# Patient Record
Sex: Male | Born: 1990 | Race: Black or African American | Hispanic: No | Marital: Single | Smoking: Never smoker
Health system: Southern US, Community
[De-identification: ages and names within clinical notes are randomized; demographics above are authoritative.]

## PROBLEM LIST (undated history)

## (undated) HISTORY — PX: NO PAST SURGERIES: SHX2092

---

## 2009-10-21 ENCOUNTER — Emergency Department (HOSPITAL_COMMUNITY): Admission: EM | Admit: 2009-10-21 | Discharge: 2009-10-21 | Payer: Self-pay | Admitting: Emergency Medicine

## 2012-01-13 ENCOUNTER — Encounter (HOSPITAL_COMMUNITY): Payer: Self-pay | Admitting: *Deleted

## 2012-01-13 ENCOUNTER — Emergency Department (INDEPENDENT_AMBULATORY_CARE_PROVIDER_SITE_OTHER): Payer: Self-pay

## 2012-01-13 ENCOUNTER — Emergency Department (INDEPENDENT_AMBULATORY_CARE_PROVIDER_SITE_OTHER)
Admission: EM | Admit: 2012-01-13 | Discharge: 2012-01-13 | Disposition: A | Discharge status: Home / Self Care | Payer: Self-pay | Source: Home / Self Care | Attending: Family Medicine | Admitting: Family Medicine

## 2012-01-13 DIAGNOSIS — IMO0002 Reserved for concepts with insufficient information to code with codable children: Secondary | ICD-10-CM

## 2012-01-13 DIAGNOSIS — S46919A Strain of unspecified muscle, fascia and tendon at shoulder and upper arm level, unspecified arm, initial encounter: Secondary | ICD-10-CM

## 2012-01-13 MED ORDER — IBUPROFEN 800 MG PO TABS: 800.0000 mg | ORAL_TABLET | Freq: Three times a day (TID) | ORAL | Status: DC

## 2012-01-13 MED ORDER — HYDROCODONE-ACETAMINOPHEN 5-325 MG PO TABS: 2.0000 | ORAL_TABLET | ORAL | Status: DC | PRN

## 2012-01-13 NOTE — ED Notes (Signed)
Pt was playing football 2 days ago and injured his right shoulder.   The pain is worse today.  He denies distal tingling, good pulses and skin warmth

## 2012-01-13 NOTE — ED Provider Notes (Signed)
History     CSN: [id number]  Arrival date & time 01/13/12  1604   None     Chief Complaint  Patient presents with  . Shoulder Pain    (Consider location/radiation/quality/duration/timing/severity/associated sxs/prior treatment) Patient is a 21 y.o. male presenting with shoulder injury. The history is provided by the patient. No language interpreter was used.  Shoulder Injury This is a new problem. The current episode started yesterday. The problem occurs constantly. The problem has been gradually worsening. Pertinent negatives include no chest pain and no headaches. The symptoms are aggravated by twisting. Nothing relieves the symptoms. He has tried nothing for the symptoms.   Pt reports he fell on his shoulder while plaing football.   Pt complains of swelling and pain History reviewed. No pertinent past medical history.  History reviewed. No pertinent past surgical history.  History reviewed. No pertinent family history.  History  Substance Use Topics  . Smoking status: Never Smoker   . Smokeless tobacco: Not on file  . Alcohol Use: Yes     Comment: occasionally      Review of Systems  Cardiovascular: Negative for chest pain.  Neurological: Negative for headaches.  All other systems reviewed and are negative.    Allergies  Review of patient's allergies indicates no known allergies.  Home Medications  No current outpatient prescriptions on file.  BP 132/81  Pulse 55  Temp 99.3 F (37.4 C) (Oral)  Resp 16  SpO2 100%  Physical Exam  Nursing note and vitals reviewed. Constitutional: He appears well-developed and well-nourished.  HENT:  Head: Normocephalic.  Right Ear: External ear normal.  Left Ear: External ear normal.  Neck: Normal range of motion.  Pulmonary/Chest: Effort normal.  Musculoskeletal: Normal range of motion.       Tender right shoulder mid joint,  No deformity,  nv and nsintact, pain with movement  Neurological: He is alert.  Skin:  Skin is warm.  Psychiatric: He has a normal mood and affect.    ED Course  Procedures (including critical care time)  Labs Reviewed - No data to display No results found.   1. Shoulder strain       MDM  Ibuprofen, hydrocodone,  Follow up with Dr. Lestine Box for evaluation        Elson Areas, [place] 01/13/12 1821  Lonia Skinner [place], [place] 01/13/12 [phone]

## 2012-01-15 NOTE — ED Provider Notes (Signed)
Medical screening examination/treatment/procedure(s) were performed by resident physician or non-physician practitioner and as supervising physician I was immediately available for consultation/collaboration.   Barkley Bruns MD.    Linna Hoff, MD 01/15/12 2119

## 2012-03-07 ENCOUNTER — Emergency Department (HOSPITAL_COMMUNITY): Payer: BC Managed Care – PPO

## 2012-03-07 ENCOUNTER — Encounter (HOSPITAL_COMMUNITY): Payer: Self-pay | Admitting: Adult Health

## 2012-03-07 ENCOUNTER — Emergency Department (HOSPITAL_COMMUNITY)
Admission: EM | Admit: 2012-03-07 | Discharge: 2012-03-07 | Disposition: A | Discharge status: Home Health | Payer: BC Managed Care – PPO | Attending: Emergency Medicine | Admitting: Emergency Medicine

## 2012-03-07 DIAGNOSIS — R040 Epistaxis: Secondary | ICD-10-CM | POA: Insufficient documentation

## 2012-03-07 DIAGNOSIS — IMO0002 Reserved for concepts with insufficient information to code with codable children: Secondary | ICD-10-CM | POA: Insufficient documentation

## 2012-03-07 DIAGNOSIS — T07XXXA Unspecified multiple injuries, initial encounter: Secondary | ICD-10-CM | POA: Insufficient documentation

## 2012-03-07 DIAGNOSIS — Z23 Encounter for immunization: Secondary | ICD-10-CM | POA: Insufficient documentation

## 2012-03-07 DIAGNOSIS — S3981XA Other specified injuries of abdomen, initial encounter: Secondary | ICD-10-CM | POA: Insufficient documentation

## 2012-03-07 MED ORDER — TETANUS-DIPHTHERIA TOXOIDS TD 5-2 LFU IM INJ
0.5000 mL | INJECTION | Freq: Once | INTRAMUSCULAR | Status: AC
  Administered 2012-03-07: 0.5 mL via INTRAMUSCULAR
  Filled 2012-03-07: qty 0.5

## 2012-03-07 MED ORDER — HYDROCODONE-ACETAMINOPHEN 5-325 MG PO TABS: 1.0000 | ORAL_TABLET | ORAL | Status: DC | PRN

## 2012-03-07 MED ORDER — OXYCODONE-ACETAMINOPHEN 5-325 MG PO TABS
2.0000 | ORAL_TABLET | Freq: Once | ORAL | Status: AC
  Administered 2012-03-07: 2 via ORAL
  Filled 2012-03-07: qty 2

## 2012-03-07 NOTE — ED Notes (Signed)
Pt refuses rib xrays. States that they do not hurt bad and that he does not want to have to pay for an un needed test

## 2012-03-07 NOTE — ED Notes (Signed)
CSI at bedside taking photographs
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
## 2012-03-07 NOTE — ED Notes (Signed)
Pt states he is feeling back to normal and wants to go home and go to bed. Friend/family member at bedside
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
## 2012-03-07 NOTE — ED Notes (Signed)
Pt remains in radiology 

## 2012-03-07 NOTE — ED Notes (Signed)
MD at bedside. C-collar removed/.

## 2012-03-08 NOTE — ED Provider Notes (Signed)
Medical screening examination/treatment/procedure(s) were performed by non-physician practitioner and as supervising physician I was immediately available for consultation/collaboration.  Chardae Mulkern T Jenavi Beedle, MD 03/08/12 2317 

## 2012-06-12 ENCOUNTER — Encounter (HOSPITAL_COMMUNITY): Payer: Self-pay | Admitting: Emergency Medicine

## 2012-06-12 ENCOUNTER — Emergency Department (INDEPENDENT_AMBULATORY_CARE_PROVIDER_SITE_OTHER): Payer: BC Managed Care – PPO

## 2012-06-12 ENCOUNTER — Emergency Department (INDEPENDENT_AMBULATORY_CARE_PROVIDER_SITE_OTHER)
Admission: EM | Admit: 2012-06-12 | Discharge: 2012-06-12 | Disposition: A | Discharge status: Home / Self Care | Payer: Worker's Compensation | Source: Home / Self Care | Attending: Family Medicine | Admitting: Family Medicine

## 2012-06-12 DIAGNOSIS — IMO0002 Reserved for concepts with insufficient information to code with codable children: Secondary | ICD-10-CM

## 2012-06-12 DIAGNOSIS — Z23 Encounter for immunization: Secondary | ICD-10-CM

## 2012-06-12 DIAGNOSIS — S61409A Unspecified open wound of unspecified hand, initial encounter: Secondary | ICD-10-CM

## 2012-06-12 DIAGNOSIS — S61431A Puncture wound without foreign body of right hand, initial encounter: Secondary | ICD-10-CM

## 2012-06-12 MED ORDER — HYDROCODONE-ACETAMINOPHEN 5-325 MG PO TABS: 1.0000 | ORAL_TABLET | ORAL | Status: DC | PRN

## 2012-06-12 MED ORDER — TETANUS-DIPHTH-ACELL PERTUSSIS 5-2.5-18.5 LF-MCG/0.5 IM SUSP
0.5000 mL | Freq: Once | INTRAMUSCULAR | Status: AC
  Administered 2012-06-12: 0.5 mL via INTRAMUSCULAR

## 2012-06-12 MED ORDER — TETANUS-DIPHTH-ACELL PERTUSSIS 5-2.5-18.5 LF-MCG/0.5 IM SUSP
INTRAMUSCULAR | Status: AC
  Filled 2012-06-12: qty 0.5
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
## 2012-06-14 ENCOUNTER — Telehealth (HOSPITAL_COMMUNITY): Payer: Self-pay | Admitting: Family Medicine

## 2012-06-14 NOTE — ED Notes (Signed)
Let message for her to send a release of medical records and note left for medical records staff

## 2012-06-14 NOTE — ED Provider Notes (Signed)
Medical screening examination/treatment/procedure(s) were performed by resident physician or non-physician practitioner and as supervising physician I was immediately available for consultation/collaboration.   Breyana Follansbee DOUGLAS MD.   Ludmila Ebarb D Ashlynd Michna, MD 06/14/12 1822 
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
## 2015-04-06 ENCOUNTER — Ambulatory Visit (INDEPENDENT_AMBULATORY_CARE_PROVIDER_SITE_OTHER): Payer: Self-pay | Admitting: Physician Assistant

## 2015-04-06 VITALS — BP 120/72 | HR 85 | Temp 98.0°F | Resp 17 | Ht 66.0 in | Wt 214.0 lb

## 2015-04-06 DIAGNOSIS — Z024 Encounter for examination for driving license: Secondary | ICD-10-CM

## 2015-04-06 DIAGNOSIS — Z021 Encounter for pre-employment examination: Secondary | ICD-10-CM

## 2015-04-06 NOTE — Progress Notes (Signed)
This patient presents for DOT examination for fitness for duty.  Pt has had DOT exams in the past.  Medical History: no  Any illness or injury in the last 5 years? no  Head/Brain Injuries, disorders or illnesses no  Seizures, epilepsy no  Eye disorders or impaired vision (except corrective lenses) no  Ear disorders, loss of hearing or balance no  Heart disease or heart attack; other cardiovascular condition no  Heart surgery (valve replacement/bypass, angioplasty, pacemaker) no  High blood pressure no  Muscular disease no  Shortness of breath no  Lung disease, emphysema, asthma, chronic bronchitis no  Kidney disease, dialysis no  Liver disease no  Digestive problems no  Diabetes or elevated blood sugar no  Nervious or psychiatric disorders, e.g., severe depression no  Loss of, or altered consciousness no  Fainting, dizziness no  Sleep disorders, pauses in breathing while asleep, daytime sleepiness, loud snoring no  Stroke or paralysis no  Missing or impaired hand, arm, foot, leg, finger, toe no  Spinal injury or disease no  Chronic low back pain no  Regular, frequent alcohol use no  Narcotic or habit forming drug use  Current Medications: Prior to Admission medications   Not on File    Medical Examiner's Comments on Health History:  Smoker - none  TESTING:   Visual Acuity Screening   Right eye Left eye Both eyes  Without correction:  With correction:     Comments: Peripheral Vision: Right eye 85 degrees. Left eye 85 degrees. The patient can distinguish the colors red, amber and green.  Hearing Screening Comments: The patient was able to hear a forced whisper from 10 feet.  Monocular Vision: No.  Hearing Aid used for test: No. Hearing Aid required to to meet standard: No.  BP 120/72 mmHg  Pulse 85  Temp(Src) 98 F (36.7 C) (Oral)  Resp 17  Ht  (1.676 m)  Wt 214 lb (97.07 kg)  BMI 34.56 kg/m2  SpO2 98% Pulse rate is regular  Urine  Specimen: Specific Gravity 1.015, Protein neg, Blood neg, Sugar neg  PHYSICAL EXAMINATION:  1. No. General Appearance: Marked overweight, tremor, signs of alcoholism, problem drinking or drug abuse. 2. No. Eyes: pupillary equality, reaction to light, accommodation, ocular motility, ocular muscle imbalance, extra ocular movement, nystagmus, exopthalmos. Ask about retinopathy, cataracts, aphakia, glaucoma, macular degeneration and refer to a specialist if appropriate.  3. No. Ears: Scarring of tympanic membrane, occlusion of external canal, perforated eardrums.     4. No. Mouth and Throat: Irremedial deformities likely to interfere with breathing or swallowing.    5. No. Heart: Murmurs, extra sounds, enlarged heart, pacemaker, implantable defibrillator.     6. No. Lungs and Chest, not including breast examination: Abnormal Chest wall expansion, abnormal respiratory rate, abnormal breath sounds including wheezes or alveolar rates, impaired respiratory function, cyanosis. Abnormal findings on physical exam may require further testing such as pulmonary tests and/or x ray of chest.  7. No. Abdomen and Viscera: Enlarged liver, enlarged spleen, masses, bruits, hernia, significant abdominal wall muscle weakness.  8. No. Vascular System: Abnormal pulse and amplitude, carotid or arterial bruits, varicose veins.    9. No. Genitourinary System: Hernia  10. No. Extremities-Limb impaired: Loss or impairment of leg, foot, toe, arm, hand, finger. Perceptible limp, deformities, atrophy, weakness, paralysis, clubbing, edema, hypotonia. Insufficient grasp and prehension to maintain steering wheel grip. Insufficient mobility and strength in lower limb to operate pedals properly. 11. No. Spine, other musculoskeletal: Previous  surgery, deformities, limitation of motion, tenderness.  12. No. Neurological: Impaired equilibrium, coordination or speech pattern; paresthesia, asymmetric deep tendon reflexes, sensory or positional  abnormalities, abnormal patellar and Babinski's reflexes, ataxia.     Comments:   Certification Status: does meet standards for 2 year certificate.   Return to medical examiner's office for follow-up on   Wearing corrective lenses: no Wearing hearing aid: no Accompanied by no waiver/exemption   Certification expires 04/05/2017  Benny Lennert PA-C  [facility] 04/06/2015 11:52 AM

## 2016-08-28 ENCOUNTER — Encounter: Payer: Self-pay | Admitting: Family Medicine

## 2016-09-01 ENCOUNTER — Ambulatory Visit (INDEPENDENT_AMBULATORY_CARE_PROVIDER_SITE_OTHER): Payer: PRIVATE HEALTH INSURANCE | Admitting: Physician Assistant

## 2016-09-01 ENCOUNTER — Encounter: Payer: Self-pay | Admitting: Physician Assistant

## 2016-09-01 VITALS — BP 140/92 | HR 74 | Temp 98.1°F | Resp 16 | Ht 67.75 in | Wt 220.8 lb

## 2016-09-01 DIAGNOSIS — R030 Elevated blood-pressure reading, without diagnosis of hypertension: Secondary | ICD-10-CM

## 2016-09-01 DIAGNOSIS — Z13228 Encounter for screening for other metabolic disorders: Secondary | ICD-10-CM

## 2016-09-01 DIAGNOSIS — Z0000 Encounter for general adult medical examination without abnormal findings: Secondary | ICD-10-CM

## 2016-09-01 DIAGNOSIS — E7800 Pure hypercholesterolemia, unspecified: Secondary | ICD-10-CM

## 2016-09-01 DIAGNOSIS — Z130 Encounter for screening for diseases of the blood and blood-forming organs and certain disorders involving the immune mechanism: Secondary | ICD-10-CM | POA: Diagnosis not present

## 2016-09-01 DIAGNOSIS — Z13220 Encounter for screening for lipoid disorders: Secondary | ICD-10-CM | POA: Diagnosis not present

## 2016-09-01 DIAGNOSIS — R519 Headache, unspecified: Secondary | ICD-10-CM

## 2016-09-01 DIAGNOSIS — Z0001 Encounter for general adult medical examination with abnormal findings: Secondary | ICD-10-CM | POA: Diagnosis not present

## 2016-09-01 DIAGNOSIS — Z114 Encounter for screening for human immunodeficiency virus [HIV]: Secondary | ICD-10-CM

## 2016-09-01 DIAGNOSIS — R51 Headache: Secondary | ICD-10-CM

## 2016-09-01 NOTE — Patient Instructions (Addendum)
   IF you received an x-ray today, you will receive an invoice from [facility]. Please contact [facility] at [phone] with questions or concerns regarding your invoice.   IF you received labwork today, you will receive an invoice from [facility]. Please contact [facility] at [phone] with questions or concerns regarding your invoice.   Our billing staff will not be able to assist you with questions regarding bills from these companies.  You will be contacted with the lab results as soon as they are available. The fastest way to get your results is to activate your My Chart account. Instructions are located on the last page of this paperwork. If you have not heard from us regarding the results in 2 weeks, please contact this office.     Hypertension Hypertension is another name for high blood pressure. High blood pressure forces your heart to work harder to pump blood. This can cause problems over time. There are two numbers in a blood pressure reading. There is a top number (systolic) over a bottom number (diastolic). It is best to have a blood pressure below 120/80. Healthy choices can help lower your blood pressure. You may need medicine to help lower your blood pressure if:  Your blood pressure cannot be lowered with healthy choices.  Your blood pressure is higher than 130/80.  Follow these instructions at home: Eating and drinking  If directed, follow the DASH eating plan. This diet includes: ? Filling half of your plate at each meal with fruits and vegetables. ? Filling one quarter of your plate at each meal with whole grains. Whole grains include whole wheat pasta, brown rice, and whole grain bread. ? Eating or drinking low-fat dairy products, such as skim milk or low-fat yogurt. ? Filling one quarter of your plate at each meal with low-fat (lean) proteins. Low-fat proteins include fish, skinless chicken, eggs, beans, and tofu. ? Avoiding fatty meat, cured  and processed meat, or chicken with skin. ? Avoiding premade or processed food.  Eat less than 1,500 mg of salt (sodium) a day.  Limit alcohol use to no more than 1 drink a day for nonpregnant women and 2 drinks a day for men. One drink equals 12 oz of beer, 5 oz of wine, or 1 oz of hard liquor. Lifestyle  Work with your doctor to stay at a healthy weight or to lose weight. Ask your doctor what the best weight is for you.  Get at least 30 minutes of exercise that causes your heart to beat faster (aerobic exercise) most days of the week. This may include walking, swimming, or biking.  Get at least 30 minutes of exercise that strengthens your muscles (resistance exercise) at least 3 days a week. This may include lifting weights or pilates.  Do not use any products that contain nicotine or tobacco. This includes cigarettes and e-cigarettes. If you need help quitting, ask your doctor.  Check your blood pressure at home as told by your doctor.  Keep all follow-up visits as told by your doctor. This is important. Medicines  Take over-the-counter and prescription medicines only as told by your doctor. Follow directions carefully.  Do not skip doses of blood pressure medicine. The medicine does not work as well if you skip doses. Skipping doses also puts you at risk for problems.  Ask your doctor about side effects or reactions to medicines that you should watch for. Contact a doctor if:  You think you are having a reaction to the   medicine you are taking.  You have headaches that keep coming back (recurring).  You feel dizzy.  You have swelling in your ankles.  You have trouble with your vision. Get help right away if:  You get a very bad headache.  You start to feel confused.  You feel weak or numb.  You feel faint.  You get very bad pain in your: ? Chest. ? Belly (abdomen).  You throw up (vomit) more than once.  You have trouble breathing. Summary  Hypertension is  another name for high blood pressure.  Making healthy choices can help lower blood pressure. If your blood pressure cannot be controlled with healthy choices, you may need to take medicine. This information is not intended to replace advice given to you by your health care provider. Make sure you discuss any questions you have with your health care provider. Document Released: 08/06/2007 Document Revised: 01/16/2016 Document Reviewed: 01/16/2016 Elsevier Interactive Patient Education  2018 Elsevier Inc.  
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
## 2016-09-02 LAB — CBC WITH DIFFERENTIAL/PLATELET
BASOS ABS: 0.1 10*3/uL (ref 0.0–0.2)
Basos: 2 %
EOS (ABSOLUTE): 0.1 10*3/uL (ref 0.0–0.4)
EOS: 1 %
HEMATOCRIT: 43.7 % (ref 37.5–51.0)
HEMOGLOBIN: 14.8 g/dL (ref 13.0–17.7)
Immature Grans (Abs): 0 10*3/uL (ref 0.0–0.1)
Immature Granulocytes: 0 %
LYMPHS ABS: 2.4 10*3/uL (ref 0.7–3.1)
Lymphs: 36 %
MCH: 28.1 pg (ref 26.6–33.0)
MCHC: 33.9 g/dL (ref 31.5–35.7)
MCV: 83 fL (ref 79–97)
MONOCYTES: 10 %
Monocytes Absolute: 0.6 10*3/uL (ref 0.1–0.9)
NEUTROS ABS: 3.3 10*3/uL (ref 1.4–7.0)
Neutrophils: 51 %
Platelets: 282 10*3/uL (ref 150–379)
RBC: 5.26 x10E6/uL (ref 4.14–5.80)
RDW: 14.2 % (ref 12.3–15.4)
WBC: 6.6 10*3/uL (ref 3.4–10.8)

## 2016-09-02 LAB — CMP14+EGFR
ALBUMIN: 4.5 g/dL (ref 3.5–5.5)
ALK PHOS: 147 IU/L — AB (ref 39–117)
ALT: 63 IU/L — ABNORMAL HIGH (ref 0–44)
AST: 27 IU/L (ref 0–40)
Albumin/Globulin Ratio: 1.5 (ref 1.2–2.2)
BILIRUBIN TOTAL: 0.4 mg/dL (ref 0.0–1.2)
BUN / CREAT RATIO: 11 (ref 9–20)
BUN: 11 mg/dL (ref 6–20)
CHLORIDE: 99 mmol/L (ref 96–106)
CO2: 25 mmol/L (ref 20–29)
Calcium: 9.8 mg/dL (ref 8.7–10.2)
Creatinine, Ser: 0.96 mg/dL (ref 0.76–1.27)
GFR calc Af Amer: 125 mL/min/{1.73_m2} (ref >59)
GFR calc non Af Amer: 108 mL/min/{1.73_m2} (ref >59)
GLOBULIN, TOTAL: 3 g/dL (ref 1.5–4.5)
Glucose: 83 mg/dL (ref 65–99)
Potassium: 3.9 mmol/L (ref 3.5–5.2)
SODIUM: 139 mmol/L (ref 134–144)
Total Protein: 7.5 g/dL (ref 6.0–8.5)

## 2016-09-02 LAB — LIPID PANEL
CHOLESTEROL TOTAL: 248 mg/dL — AB (ref 100–199)
Chol/HDL Ratio: 7.5 ratio — ABNORMAL HIGH (ref 0.0–5.0)
HDL: 33 mg/dL — ABNORMAL LOW (ref >39)
LDL CALC: 162 mg/dL — AB (ref 0–99)
Triglycerides: 264 mg/dL — ABNORMAL HIGH (ref 0–149)
VLDL Cholesterol Cal: 53 mg/dL — ABNORMAL HIGH (ref 5–40)

## 2016-09-02 LAB — TSH: TSH: 2.71 u[IU]/mL (ref 0.450–4.500)

## 2016-09-02 LAB — HIV ANTIBODY (ROUTINE TESTING W REFLEX): HIV SCREEN 4TH GENERATION: NONREACTIVE

## 2016-09-06 DIAGNOSIS — E7800 Pure hypercholesterolemia, unspecified: Secondary | ICD-10-CM | POA: Insufficient documentation

## 2016-09-06 DIAGNOSIS — R030 Elevated blood-pressure reading, without diagnosis of hypertension: Secondary | ICD-10-CM | POA: Insufficient documentation

## 2016-09-06 MED ORDER — ROSUVASTATIN CALCIUM 20 MG PO TABS: 20.0000 mg | ORAL_TABLET | Freq: Every day | ORAL | 0 refills | Status: DC

## 2016-09-06 NOTE — Addendum Note (Signed)
Addended by: Morrell RiddleWEBER, Akita Maxim L on: 09/06/2016 05:22 PM   Modules accepted: Orders

## 2016-10-15 ENCOUNTER — Ambulatory Visit (INDEPENDENT_AMBULATORY_CARE_PROVIDER_SITE_OTHER): Payer: PRIVATE HEALTH INSURANCE | Admitting: Physician Assistant

## 2016-10-15 ENCOUNTER — Encounter: Payer: Self-pay | Admitting: Physician Assistant

## 2016-10-15 VITALS — BP 150/89 | HR 69 | Temp 98.3°F | Resp 16 | Ht 67.75 in | Wt 213.4 lb

## 2016-10-15 DIAGNOSIS — L988 Other specified disorders of the skin and subcutaneous tissue: Secondary | ICD-10-CM | POA: Diagnosis not present

## 2016-10-15 MED ORDER — DOXYCYCLINE HYCLATE 100 MG PO CAPS: 100.0000 mg | ORAL_CAPSULE | Freq: Two times a day (BID) | ORAL | 0 refills | Status: AC

## 2016-10-15 MED ORDER — MELOXICAM 15 MG PO TABS: 15.0000 mg | ORAL_TABLET | Freq: Every day | ORAL | 0 refills | Status: DC

## 2016-10-15 NOTE — Progress Notes (Signed)
PRIMARY CARE AT [facility] [address] [place] [address] [phone]  Date:  10/15/2016   Name:  Marvin Dean   DOB:  23-Aug-1990   MRN:  [id number]  PCP:  Patient, No Pcp Per    History of Present Illness:  Marvin Dean is a 26 y.o. male patient who presents to PCP with  Chief Complaint  Patient presents with  . Back Pain    having numbness and pain when pt sits down x1day     Pain started yesterday when he woke up, where he noticed the pain when he sits.  Now it is painful to sit down.  He has noticed it in his tailbone.  He can feel sensation of "numbness".  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  He was able to play softball yesterday without pain.  No pain with running.  No pain with bowel movement.  Urination normal.  He has never injured this area in the past.    Patient Active Problem List   Diagnosis Date Noted  . Pure hypercholesterolemia 09/06/2016  . Elevated BP without diagnosis of hypertension 09/06/2016    No past medical history on file.  No past surgical history on file.  Social History  Substance Use Topics  . Smoking status: Never Smoker  . Smokeless tobacco: Never Used  . Alcohol use Yes     Comment: 2 drinks a week    Family History  Problem Relation Age of Onset  . COPD Mother   . Lung cancer Mother     No Known Allergies  Medication list has been reviewed and updated.  Current Outpatient Prescriptions on File Prior to Visit  Medication Sig Dispense Refill  . rosuvastatin (CRESTOR) 20 MG tablet Take 1 tablet (20 mg total) by mouth daily. (Patient not taking: Reported on 10/15/2016) 90 tablet 0   No current facility-administered medications on file prior to visit.     ROS ROS otherwise unremarkable unless listed above.  Physical Examination: BP (!) 150/89   Pulse 69   Temp 98.3 F (36.8 C) (Oral)   Resp 16   Ht 5' 7.75" (1.721 m)   Wt 213 lb 6.4 oz (96.8 kg)   SpO2 99%   BMI 32.69 kg/m  Ideal Body Weight: Weight  in (lb) to have BMI = 25: 162.9  Physical Exam  Constitutional: He is oriented to person, place, and time. He appears well-developed and well-nourished. No distress.  HENT:  Head: Normocephalic and atraumatic.  Eyes: Pupils are equal, round, and reactive to light. Conjunctivae and EOM are normal.  Cardiovascular: Normal rate.   Pulmonary/Chest: Effort normal. No respiratory distress.  Neurological: He is alert and oriented to person, place, and time.  Skin: Skin is warm and dry. He is not diaphoretic.  Just inferior to the gluteal cleft, is slight breaking down of skin and tenderness.  No fluctuance or warmth.  Possible area of punctate.  Psychiatric: He has a normal mood and affect. His behavior is normal.     Assessment and Plan: Marvin Dean is a 26 y.o. male who is here today for cc of  Chief Complaint  Patient presents with  . Back Pain    having numbness and pain when pt sits down x1day   Coccyalgia vs pilonidal disease Likely pilonidal.  Advised warm compresses, avoidance of pressure to area, and abx follow up in 48-72 hours if no improvement. Pilonidal disease - Plan: meloxicam (MOBIC) 15 MG tablet,  doxycycline (VIBRAMYCIN) 100 MG capsule  Trena PlattStephanie English, PA-C [facility] 9/2/20189:32 AM

## 2016-10-15 NOTE — Patient Instructions (Addendum)
Please use the warm compresses at least 3 times per day for 15 minutes I would like you to take the medications as prescribed.  If the symptoms resolve within 7 days, you may stop the doxycycline--but take for at least 7 days. Pilonidal disease can be classified as a disease, where you have to be mindful of the risk below, to avoid it starting up the pain and inflammation again.  Please review.   Pilonidal Cyst A pilonidal cyst is a fluid-filled sac. It forms beneath the skin near your tailbone, at the top of the crease of your buttocks. A pilonidal cyst that is not large or infected may not cause symptoms or problems. If the cyst becomes irritated or infected, it may fill with pus. This causes pain and swelling (pilonidal abscess). An infected cyst may need to be treated with medicine, drained, or removed. What are the causes? The cause of a pilonidal cyst is not known. One cause may be a hair that grows into your skin (ingrown hair). What increases the risk? Pilonidal cysts are more common in boys and men. Risk factors include:  Having lots of hair near the crease of the buttocks.  Being overweight.  Having a pilonidal dimple.  Wearing tight clothing.  Not bathing or showering frequently.  Sitting for long periods of time.  What are the signs or symptoms? Signs and symptoms of a pilonidal cyst may include:  Redness.  Pain and tenderness.  Warmth.  Swelling.  Pus.  Fever.  How is this diagnosed? Your health care provider may diagnose a pilonidal cyst based on your symptoms and a physical exam. The health care provider may do a blood test to check for infection. If your cyst is draining pus, your health care provider may take a sample of the drainage to be tested at a laboratory. How is this treated? Surgery is the usual treatment for an infected pilonidal cyst. You may also have to take medicines before surgery. The type of surgery you have depends on the size and severity  of the infected cyst. The different kinds of surgery include:  Incision and drainage. This is a procedure to open and drain the cyst.  Marsupialization. In this procedure, a large cyst or abscess may be opened and kept open by stitching the edges of the skin to the cyst walls.  Cyst removal. This procedure involves opening the skin and removing all or part of the cyst.  Follow these instructions at home:  Follow all of your surgeon's instructions carefully if you had surgery.  Take medicines only as directed by your health care provider.  If you were prescribed an antibiotic medicine, finish it all even if you start to feel better.  Keep the area around your pilonidal cyst clean and dry.  Clean the area as directed by your health care provider. Pat the area dry with a clean towel. Do not rub it as this may cause bleeding.  Remove hair from the area around the cyst as directed by your health care provider.  Do not wear tight clothing or sit in one place for long periods of time.  There are many different ways to close and cover an incision, including stitches, skin glue, and adhesive strips. Follow your health care provider's instructions on: ? Incision care. ? Bandage (dressing) changes and removal. ? Incision closure removal. Contact a health care provider if:  You have drainage, redness, swelling, or pain at the site of the cyst.  You have a  fever. This information is not intended to replace advice given to you by your health care provider. Make sure you discuss any questions you have with your health care provider. Document Released: 02/15/2000 Document Revised: 07/26/2015 Document Reviewed: 07/07/2013 Elsevier Interactive Patient Education  2018 [employer]     IF you received an x-ray today, you will receive an invoice from [facility]. Please contact [facility] at [phone] with questions or concerns regarding your invoice.   IF you received  labwork today, you will receive an [facility] from [facility]. Please contact [facility] at [phone] with questions or concerns regarding your invoice.   Our billing staff will not be able to assist you with questions regarding bills from these companies.  You will be contacted with the lab results as soon as they are available. The fastest way to get your results is to activate your My Chart account. Instructions are located on the last page of this paperwork. If you have not heard from us regarding the results in 2 weeks, please contact this office.

## 2016-10-17 ENCOUNTER — Ambulatory Visit (INDEPENDENT_AMBULATORY_CARE_PROVIDER_SITE_OTHER): Payer: PRIVATE HEALTH INSURANCE | Admitting: Physician Assistant

## 2016-10-17 ENCOUNTER — Encounter: Payer: Self-pay | Admitting: Physician Assistant

## 2016-10-17 VITALS — BP 132/80 | HR 80 | Temp 98.8°F | Resp 18 | Ht 68.11 in | Wt 215.4 lb

## 2016-10-17 DIAGNOSIS — L0501 Pilonidal cyst with abscess: Secondary | ICD-10-CM | POA: Diagnosis not present

## 2016-10-17 DIAGNOSIS — L918 Other hypertrophic disorders of the skin: Secondary | ICD-10-CM

## 2016-10-17 NOTE — Progress Notes (Signed)
Marvin Dean  MRN: [id number] DOB: [birth year]  PCP: Patient, No Pcp Per  Chief Complaint  Patient presents with  . Mass    X 4 days- Cyst f/u    Subjective:  Pt presents to clinic for pilonidal cyst - he is taking medication as Rx - not having any problems but the pain is not getting better.  He has been doing warm compresses but it is not getting better.  He has never had one of these before.  He also has a spot on his neck that he would like to know what it is.  History is obtained by patient.  Review of Systems  Constitutional: Negative for chills and fever.  Skin: Positive for wound.    Patient Active Problem List   Diagnosis Date Noted  . Pure hypercholesterolemia 09/06/2016  . Elevated BP without diagnosis of hypertension 09/06/2016    Current Outpatient Prescriptions on File Prior to Visit  Medication Sig Dispense Refill  . doxycycline (VIBRAMYCIN) 100 MG capsule Take 1 capsule (100 mg total) by mouth 2 (two) times daily. 20 capsule 0  . meloxicam (MOBIC) 15 MG tablet Take 1 tablet (15 mg total) by mouth daily. 30 tablet 0  . rosuvastatin (CRESTOR) 20 MG tablet Take 1 tablet (20 mg total) by mouth daily. (Patient not taking: Reported on 10/15/2016) 90 tablet 0   No current facility-administered medications on file prior to visit.     No Known Allergies  History reviewed. No pertinent past medical history. Social History   Social History Narrative   Lives alone      Seatbelt - most of the time      Guns - securedf   Social History  Substance Use Topics  . Smoking status: Never Smoker  . Smokeless tobacco: Never Used  . Alcohol use Yes     Comment: 2 drinks a week   family history includes COPD in his mother; Lung cancer in his mother.     Objective:  BP 132/80 (BP Location: Right Arm, Patient Position: Sitting, Cuff Size: Large)   Pulse 80   Temp 98.8 F (37.1 C) (Oral)   Resp 18   Ht 5' 8.11" (1.73 m)   Wt 215 lb 6.4 oz (97.7 kg)   BMI  32.65 kg/m  Body mass index is 32.65 kg/m.  Physical Exam  Constitutional: He is oriented to person, place, and time and well-developed, well-nourished, and in no distress.  HENT:  Head: Normocephalic and atraumatic.  Right Ear: External ear normal.  Left Ear: External ear normal.  Eyes: Conjunctivae are normal.  Neck: Normal range of motion.  Pulmonary/Chest: Effort normal.  Genitourinary:  Genitourinary Comments: Pilonidal cyst - tender and fluctuant midline with erythema and swelling in surrounding tissue more on the left that the right.  Multiple pores with purulent material.  Neurological: He is alert and oriented to person, place, and time. Gait normal.  Skin: Skin is warm and dry.  Psychiatric: Mood, memory, affect and judgment normal.   Procedure: Consent obtained - local anesthesia with 2% lido with epi.  Betadine prep.  #11 blade. used to make a 2cm incision - purulence expressed from wound cavity - 1/4 in plain packing placed into wound.  Drsg placed.  Skin tag was removed.   Assessment and Plan :  Pilonidal cyst with abscess  Skin tag   Continue abx - warm compresses - pull out packing in 72h and then can do warm soaks and gentle friction washing.  Benny Lennert PA-C  Primary Care at [facility] 10/17/2016 2:58 PM

## 2016-10-17 NOTE — Patient Instructions (Addendum)
Continue antibiotics. Dry warm compresses with a rice sock in the microwave. Pull out packing on Monday evening - after than ok to soak in bathtub and do gentle scrubbing of the lesion to present scab from forming.   IF you received an x-ray today, you will receive an invoice from [facility]. Please contact [facility] at [phone] with questions or concerns regarding your invoice.   IF you received labwork today, you will receive an invoice from [facility]. Please contact [facility] at [phone] with questions or concerns regarding your invoice.   Our billing staff will not be able to assist you with questions regarding bills from these companies.  You will be contacted with the lab results as soon as they are available. The fastest way to get your results is to activate your My Chart account. Instructions are located on the last page of this paperwork. If you have not heard from [place] regarding the results in 2 weeks, please contact this office.

## 2016-10-20 ENCOUNTER — Ambulatory Visit (INDEPENDENT_AMBULATORY_CARE_PROVIDER_SITE_OTHER): Payer: PRIVATE HEALTH INSURANCE | Admitting: Physician Assistant

## 2016-10-20 ENCOUNTER — Encounter: Payer: Self-pay | Admitting: Physician Assistant

## 2016-10-20 VITALS — BP 130/77 | HR 63 | Temp 98.4°F | Resp 16 | Ht 68.11 in | Wt 214.0 lb

## 2016-10-20 DIAGNOSIS — L0501 Pilonidal cyst with abscess: Secondary | ICD-10-CM

## 2016-10-20 NOTE — Patient Instructions (Signed)
     IF you received an x-ray today, you will receive an invoice from [facility]. Please contact [facility] at [phone] with questions or concerns regarding your invoice.   IF you received labwork today, you will receive an invoice from [facility]. Please contact [facility] at [phone] with questions or concerns regarding your invoice.   Our billing staff will not be able to assist you with questions regarding bills from these companies.  You will be contacted with the lab results as soon as they are available. The fastest way to get your results is to activate your My Chart account. Instructions are located on the last page of this paperwork. If you have not heard from us regarding the results in 2 weeks, please contact this office.     

## 2016-10-20 NOTE — Progress Notes (Signed)
   Marvin Dean  MRN: [id number] DOB: Feb 01, 1991  PCP: Patient, No Pcp Per  Chief Complaint  Patient presents with  . Wound Check    follow up     Subjective:  Pt presents to clinic for recheck pilonidal abscess.  Taking the abx without problems.  Doing warm compresses.  Much less pain, minimal drainage - he does feel like the area gets more hard and tender after he takes the doxy.  History is obtained by patient.  Review of Systems  Constitutional: Negative for chills and fever.  Gastrointestinal: Negative for nausea.  Skin: Positive for wound.    Patient Active Problem List   Diagnosis Date Noted  . Pure hypercholesterolemia 09/06/2016  . Elevated BP without diagnosis of hypertension 09/06/2016    Current Outpatient Prescriptions on File Prior to Visit  Medication Sig Dispense Refill  . doxycycline (VIBRAMYCIN) 100 MG capsule Take 1 capsule (100 mg total) by mouth 2 (two) times daily. 20 capsule 0  . meloxicam (MOBIC) 15 MG tablet Take 1 tablet (15 mg total) by mouth daily. 30 tablet 0  . rosuvastatin (CRESTOR) 20 MG tablet Take 1 tablet (20 mg total) by mouth daily. (Patient not taking: Reported on 10/15/2016) 90 tablet 0   No current facility-administered medications on file prior to visit.     No Known Allergies  No past medical history on file. Social History   Social History Narrative   Lives alone      Seatbelt - most of the time      Guns - securedf   Social History  Substance Use Topics  . Smoking status: Never Smoker  . Smokeless tobacco: Never Used  . Alcohol use Yes     Comment: 2 drinks a week   family history includes COPD in his mother; Lung cancer in his mother.     Objective:  BP 130/77   Pulse 63   Temp 98.4 F (36.9 C) (Oral)   Resp 16   Ht 5' 8.11" (1.73 m)   Wt 214 lb (97.1 kg)   SpO2 98%   BMI 32.43 kg/m  Body mass index is 32.43 kg/m.  Physical Exam  Constitutional: He is oriented to person, place, and time and  well-developed, well-nourished, and in no distress.  HENT:  Head: Normocephalic and atraumatic.  Right Ear: External ear normal.  Left Ear: External ear normal.  Eyes: Conjunctivae are normal.  Neck: Normal range of motion.  Pulmonary/Chest: Effort normal.  Neurological: He is alert and oriented to person, place, and time. Gait normal.  Skin: Skin is warm and dry.  drsg removed packing removed - less induration and no erythema - minimal TTP - small amount of serosanginous drainage from the wound cavity  Psychiatric: Mood, memory, affect and judgment normal.    Assessment and Plan :  Pilonidal cyst with abscess - much better - finish abs - continue to wear drsg just for drainage purposes - recheck only if problems  Benny Lennert PA-C  Primary Care at [facility] 10/20/2016 5:16 PM

## 2016-11-04 ENCOUNTER — Ambulatory Visit: Payer: PRIVATE HEALTH INSURANCE | Admitting: Physician Assistant

## 2016-12-02 ENCOUNTER — Ambulatory Visit: Payer: PRIVATE HEALTH INSURANCE | Admitting: Physician Assistant

## 2016-12-06 ENCOUNTER — Ambulatory Visit (INDEPENDENT_AMBULATORY_CARE_PROVIDER_SITE_OTHER): Payer: PRIVATE HEALTH INSURANCE | Admitting: Physician Assistant

## 2016-12-06 ENCOUNTER — Encounter: Payer: Self-pay | Admitting: Physician Assistant

## 2016-12-06 VITALS — BP 116/68 | HR 68 | Temp 98.5°F | Resp 18 | Ht 68.66 in | Wt 214.4 lb

## 2016-12-06 DIAGNOSIS — R945 Abnormal results of liver function studies: Secondary | ICD-10-CM

## 2016-12-06 DIAGNOSIS — R7989 Other specified abnormal findings of blood chemistry: Secondary | ICD-10-CM

## 2016-12-06 DIAGNOSIS — E7800 Pure hypercholesterolemia, unspecified: Secondary | ICD-10-CM | POA: Diagnosis not present

## 2016-12-06 NOTE — Patient Instructions (Addendum)
   IF you received an x-ray today, you will receive an invoice from [facility]. Please contact [facility] at [phone] with questions or concerns regarding your invoice.   IF you received labwork today, you will receive an invoice from [facility]. Please contact [facility] at [phone] with questions or concerns regarding your invoice.   Our billing staff will not be able to assist you with questions regarding bills from these companies.  You will be contacted with the lab results as soon as they are available. The fastest way to get your results is to activate your My Chart account. Instructions are located on the last page of this paperwork. If you have not heard from us regarding the results in 2 weeks, please contact this office.     Cholesterol Cholesterol is a white, waxy, fat-like substance that is needed by the human body in small amounts. The liver makes all the cholesterol we need. Cholesterol is carried from the liver by the blood through the blood vessels. Deposits of cholesterol (plaques) may build up on blood vessel (artery) walls. Plaques make the arteries narrower and stiffer. Cholesterol plaques increase the risk for heart attack and stroke. You cannot feel your cholesterol level even if it is very high. The only way to know that it is high is to have a blood test. Once you know your cholesterol levels, you should keep a record of the test results. Work with your health care provider to keep your levels in the desired range. What do the results mean?  Total cholesterol is a rough measure of all the cholesterol in your blood.  LDL (low-density lipoprotein) is the "bad" cholesterol. This is the type that causes plaque to build up on the artery walls. You want this level to be low.  HDL (high-density lipoprotein) is the "good" cholesterol because it cleans the arteries and carries the LDL away. You want this level to be high.  Triglycerides are fat that  the body can either burn for energy or store. High levels are closely linked to heart disease. What are the desired levels of cholesterol?  Total cholesterol below 200.  LDL below 100 for people who are at risk, below 70 for people at very high risk.  HDL above 40 is good. A level of 60 or higher is considered to be protective against heart disease.  Triglycerides below 150. How can I lower my cholesterol? Diet  Follow your diet program as told by your health care provider.  Choose fish or white meat chicken and turkey, roasted or baked. Limit fatty cuts of red meat, fried foods, and processed meats, such as sausage and lunch meats.  Eat lots of fresh fruits and vegetables.  Choose whole grains, beans, pasta, potatoes, and cereals.  Choose olive oil, corn oil, or canola oil, and use only small amounts.  Avoid butter, mayonnaise, shortening, or palm kernel oils.  Avoid foods with trans fats.  Drink skim or nonfat milk and eat low-fat or nonfat yogurt and cheeses. Avoid whole milk, cream, ice cream, egg yolks, and full-fat cheeses.  Healthier desserts include angel food cake, ginger snaps, animal crackers, hard candy, popsicles, and low-fat or nonfat frozen yogurt. Avoid pastries, cakes, pies, and cookies. Exercise  Follow your exercise program as told by your health care provider. A regular program:  Helps to decrease LDL and raise HDL.  Helps with weight control.  Do things that increase your activity level, such as gardening, walking, and taking the stairs.    Ask your health care provider about ways that you can be more active in your daily life. Medicine  Take over-the-counter and prescription medicines only as told by your health care provider.  Medicine may be prescribed by your health care provider to help lower cholesterol and decrease the risk for heart disease. This is usually done if diet and exercise have failed to bring down cholesterol levels.  If you have  several risk factors, you may need medicine even if your levels are normal. This information is not intended to replace advice given to you by your health care provider. Make sure you discuss any questions you have with your health care provider. Document Released: 11/12/2000 Document Revised: 09/15/2015 Document Reviewed: 08/18/2015 Elsevier Interactive Patient Education  2017 Elsevier Inc.  

## 2016-12-06 NOTE — Progress Notes (Signed)
   Marvin Dean  MRN: [id number] DOB: 10/19/1990  PCP: Patient, No Pcp Per  Chief Complaint  Patient presents with  . Labs Only    Subjective:  Pt presents to clinic for lab recheck.  He was able to decrease ETOH until football season but it is still less than it was before.  He was able to change his diet but he has been eating slightly more foods since football season started  History is obtained by patient.  Review of Systems  Constitutional: Negative for chills and fever.  Eyes: Negative for visual disturbance.  Respiratory: Negative for cough and shortness of breath.   Cardiovascular: Negative for chest pain, palpitations and leg swelling.  Neurological: Negative for dizziness, light-headedness and headaches.    Patient Active Problem List   Diagnosis Date Noted  . Pure hypercholesterolemia 09/06/2016  . Elevated BP without diagnosis of hypertension 09/06/2016    Current Outpatient Prescriptions on File Prior to Visit  Medication Sig Dispense Refill  . meloxicam (MOBIC) 15 MG tablet Take 1 tablet (15 mg total) by mouth daily. (Patient not taking: Reported on 12/06/2016) 30 tablet 0   No current facility-administered medications on file prior to visit.     No Known Allergies  History reviewed. No pertinent past medical history. Social History   Social History Narrative   Lives alone   Plays sfotball 2x/week   Seatbelt - most of the time      Guns - secured   Social History  Substance Use Topics  . Smoking status: Never Smoker  . Smokeless tobacco: Never Used  . Alcohol use Yes     Comment: 2 drinks a week   family history includes COPD in his mother; Lung cancer in his mother.     Objective:  BP 116/68 (BP Location: Left Arm, Patient Position: Sitting, Cuff Size: Large)   Pulse 68   Temp 98.5 F (36.9 C) (Oral)   Resp 18   Ht 5' 8.66" (1.744 m)   Wt 214 lb 6.4 oz (97.3 kg)   SpO2 100%   BMI 31.97 kg/m  Body mass index is 31.97  kg/m.  Physical Exam  Constitutional: He is oriented to person, place, and time and well-developed, well-nourished, and in no distress.  HENT:  Head: Normocephalic and atraumatic.  Right Ear: External ear normal.  Left Ear: External ear normal.  Eyes: Conjunctivae are normal.  Neck: Normal range of motion.  Cardiovascular: Normal rate, regular rhythm and normal heart sounds.   No murmur heard. Pulmonary/Chest: Effort normal and breath sounds normal. He has no wheezes.  Neurological: He is alert and oriented to person, place, and time. Gait normal.  Skin: Skin is warm and dry.  Psychiatric: Mood, memory, affect and judgment normal.    Wt Readings from Last 3 Encounters:  12/06/16 214 lb 6.4 oz (97.3 kg)  10/20/16 214 lb (97.1 kg)  10/17/16 215 lb 6.4 oz (97.7 kg)    Assessment and Plan :  Elevated cholesterol - Plan: Lipid panel  Elevated LFTs - Plan: CMP14+EGFR   Pt has made lifestyle changes and he hopes that his labs are better.  We will check labs and add medications if needed.  Crestor was to expensive so he wants to do lifestyle changes.  Windell Hummingbird PA-C  Primary Care at [facility] 12/06/2016 9:27 AM

## 2016-12-07 LAB — CMP14+EGFR
ALT: 41 IU/L (ref 0–44)
AST: 16 IU/L (ref 0–40)
Albumin/Globulin Ratio: 1.7 (ref 1.2–2.2)
Albumin: 4.7 g/dL (ref 3.5–5.5)
Alkaline Phosphatase: 141 IU/L — ABNORMAL HIGH (ref 39–117)
BUN/Creatinine Ratio: 11 (ref 9–20)
BUN: 11 mg/dL (ref 6–20)
Bilirubin Total: 0.7 mg/dL (ref 0.0–1.2)
CO2: 26 mmol/L (ref 20–29)
Calcium: 9.7 mg/dL (ref 8.7–10.2)
Chloride: 102 mmol/L (ref 96–106)
Creatinine, Ser: 1 mg/dL (ref 0.76–1.27)
GFR calc Af Amer: 119 mL/min/{1.73_m2} (ref >59)
GFR calc non Af Amer: 103 mL/min/{1.73_m2} (ref >59)
Globulin, Total: 2.8 g/dL (ref 1.5–4.5)
Glucose: 91 mg/dL (ref 65–99)
Potassium: 4.3 mmol/L (ref 3.5–5.2)
Sodium: 139 mmol/L (ref 134–144)
Total Protein: 7.5 g/dL (ref 6.0–8.5)

## 2016-12-07 LAB — LIPID PANEL
Chol/HDL Ratio: 7.3 ratio — ABNORMAL HIGH (ref 0.0–5.0)
Cholesterol, Total: 255 mg/dL — ABNORMAL HIGH (ref 100–199)
HDL: 35 mg/dL — ABNORMAL LOW (ref >39)
LDL Calculated: 193 mg/dL — ABNORMAL HIGH (ref 0–99)
Triglycerides: 133 mg/dL (ref 0–149)
VLDL Cholesterol Cal: 27 mg/dL (ref 5–40)

## 2016-12-15 MED ORDER — ATORVASTATIN CALCIUM 40 MG PO TABS: 40.0000 mg | ORAL_TABLET | Freq: Every day | ORAL | 0 refills | Status: DC

## 2016-12-15 NOTE — Addendum Note (Signed)
Addended by: Morrell Riddle on: 12/15/2016 12:32 PM   Modules accepted: Orders

## 2016-12-17 ENCOUNTER — Encounter: Payer: Self-pay | Admitting: Radiology

## 2017-04-04 ENCOUNTER — Encounter: Payer: Self-pay | Admitting: Physician Assistant

## 2017-04-04 ENCOUNTER — Ambulatory Visit (INDEPENDENT_AMBULATORY_CARE_PROVIDER_SITE_OTHER): Payer: Self-pay | Admitting: Physician Assistant

## 2017-04-04 ENCOUNTER — Other Ambulatory Visit: Payer: Self-pay

## 2017-04-04 VITALS — BP 130/82 | HR 68 | Temp 98.2°F | Resp 16 | Ht 68.5 in | Wt 228.1 lb

## 2017-04-04 DIAGNOSIS — Z0289 Encounter for other administrative examinations: Secondary | ICD-10-CM

## 2017-04-04 NOTE — Progress Notes (Signed)
Commercial Driver Medical Examination   Ramiro HarvestKenneth Routson is a 27 y.o. 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 The patient reports no problems today. In the past the patient reports receiving 2 year certificates. He denies focal neurological deficits, vision and hearing changes. He denies the habitual use of benzodiazepines, opioids, amphetamines and denies illicit drug use.   Current medications, family history, allergies, social history reviewed by me and exist elsewhere in the encounter.   Review of Systems  Constitutional: Negative for chills, diaphoresis and fever.  Eyes: Negative.   Respiratory: Negative for cough, hemoptysis, sputum production, shortness of breath and wheezing.   Cardiovascular: Negative for chest pain, orthopnea and leg swelling.  Gastrointestinal: Negative for abdominal pain, blood in stool, constipation, diarrhea, heartburn, melena, nausea and vomiting.  Genitourinary: Negative for dysuria, flank pain, frequency, hematuria and urgency.  Skin: Negative for rash.  Neurological: Negative for dizziness, sensory change, speech change, focal weakness and headaches.    Objective:     Vision/hearing:  Visual Acuity Screening   Right eye Left eye Both eyes  Without correction: 20/20 20/20 20/20   With correction:     Comments: Titmus:  85% Colors 6/6  Hearing Screening Comments: Whisper 9 ft  Applicant can recognize and distinguish among traffic control signals and devices showing standard red, green, and amber colors.  Corrective lenses required: No  Monocular Vision?: No  Hearing aid requirement: No  Physical Exam  Constitutional: He is oriented to person, place, and time. He appears well-developed. He is active and cooperative.  Non-toxic appearance.  HENT:  Right Ear: Hearing, tympanic membrane, external ear and ear canal normal.  Left Ear: Hearing, tympanic  membrane, external ear and ear canal normal.  Nose: Nose normal. Right sinus exhibits no maxillary sinus tenderness and no frontal sinus tenderness. Left sinus exhibits no maxillary sinus tenderness and no frontal sinus tenderness.  Mouth/Throat: Uvula is midline, oropharynx is clear and moist and mucous membranes are normal. No oropharyngeal exudate, posterior oropharyngeal edema or tonsillar abscesses.  Eyes: Conjunctivae and EOM are normal. Pupils are equal, round, and reactive to light.  Cardiovascular: Normal rate, regular rhythm, S1 normal, S2 normal, normal heart sounds, intact distal pulses and normal pulses. Exam reveals no gallop and no friction rub.  No murmur heard. Pulmonary/Chest: Effort normal. No stridor. No tachypnea. No respiratory distress. He has no wheezes. He has no rales.  Abdominal: Soft. Normal appearance and bowel sounds are normal. He exhibits no distension and no mass. There is no tenderness. There is no rigidity, no rebound, no guarding and no CVA tenderness. No hernia.  Musculoskeletal: He exhibits no edema.  Lymphadenopathy:       Head (right side): No submandibular and no tonsillar adenopathy present.       Head (left side): No submandibular and no tonsillar adenopathy present.    He has no cervical adenopathy.  Neurological: He is alert and oriented to person, place, and time. He has normal strength and normal reflexes. He is not disoriented. No cranial nerve deficit or sensory deficit. He exhibits normal muscle tone. Coordination and gait normal.  Skin: Skin is warm and dry. He is not diaphoretic. No pallor.  Psychiatric: His behavior is normal.  Vitals reviewed.   BP 130/82   Pulse 68   Temp 98.2 F (36.8 C) (Oral)   Resp 16   Ht 5' 8.5" (1.74 m)   Wt 228 lb 2 oz (  103.5 kg)   SpO2 97%   BMI 34.18 kg/m   Labs: Comments: Blood-Negative, Protein-Negatve, Glucose-Negative, SG->1.030  Assessment:    Healthy male exam.  Meets standards in 5649 CFR  391.41;  qualifies for 2 year certificate.    Plan:    Medical examiners certificate completed and printed. Return as needed.    Deliah BostonMichael Jerell Demery, MS, PA-C 9:56 AM, 04/04/2017

## 2017-04-04 NOTE — Progress Notes (Signed)
Commercial Driver Medical Examination   Marvin HarvestKenneth Dean is a 27 y.o. 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 The patient reports no problems today. In the past the patient reports receiving 2 year certificates. He denies focal neurological deficits, vision and hearing changes. He denies the habitual use of benzodiazepines, opioids, amphetamines and denies illicit drug use.   Current medications, family history, allergies, social history reviewed by me and exist elsewhere in the encounter.   Review of Systems  Constitutional: Negative for chills, diaphoresis and fever.  Eyes: Negative.   Respiratory: Negative for cough, hemoptysis, sputum production, shortness of breath and wheezing.   Cardiovascular: Negative for chest pain, orthopnea and leg swelling.  Gastrointestinal: Negative for abdominal pain, blood in stool, constipation, diarrhea, heartburn, melena, nausea and vomiting.  Genitourinary: Negative for dysuria, flank pain, frequency, hematuria and urgency.  Skin: Negative for rash.  Neurological: Negative for dizziness, sensory change, speech change, focal weakness and headaches.    Objective:     Vision/hearing:  Visual Acuity Screening   Right eye Left eye Both eyes  Without correction: 20/20 20/20 20/20   With correction:     Comments: Titmus:  85% Colors 6/6  Hearing Screening Comments: Whisper 9 ft  Applicant can recognize and distinguish among traffic control signals and devices showing standard red, green, and amber colors.  Corrective lenses required: No  Monocular Vision?: No  Hearing aid requirement: No  Physical Exam  Constitutional: He is oriented to person, place, and time. He appears well-developed. He is active and cooperative.  Non-toxic appearance.  HENT:  Right Ear: Hearing, tympanic membrane, external ear and ear canal normal.  Left Ear: Hearing, tympanic  membrane, external ear and ear canal normal.  Nose: Nose normal. Right sinus exhibits no maxillary sinus tenderness and no frontal sinus tenderness. Left sinus exhibits no maxillary sinus tenderness and no frontal sinus tenderness.  Mouth/Throat: Uvula is midline, oropharynx is clear and moist and mucous membranes are normal. No oropharyngeal exudate, posterior oropharyngeal edema or tonsillar abscesses.  Eyes: Conjunctivae and EOM are normal. Pupils are equal, round, and reactive to light.  Cardiovascular: Normal rate, regular rhythm, S1 normal, S2 normal, normal heart sounds, intact distal pulses and normal pulses. Exam reveals no gallop and no friction rub.  No murmur heard. Pulmonary/Chest: Effort normal. No stridor. No tachypnea. No respiratory distress. He has no wheezes. He has no rales.  Abdominal: Soft. Normal appearance and bowel sounds are normal. He exhibits no distension and no mass. There is no tenderness. There is no rigidity, no rebound, no guarding and no CVA tenderness. No hernia.  Musculoskeletal: He exhibits no edema.  Lymphadenopathy:       Head (right side): No submandibular and no tonsillar adenopathy present.       Head (left side): No submandibular and no tonsillar adenopathy present.    He has no cervical adenopathy.  Neurological: He is alert and oriented to person, place, and time. He has normal strength and normal reflexes. He is not disoriented. No cranial nerve deficit or sensory deficit. He exhibits normal muscle tone. Coordination and gait normal.  Skin: Skin is warm and dry. He is not diaphoretic. No pallor.  Psychiatric: His behavior is normal.  Vitals reviewed.   BP 130/82   Pulse 68   Temp 98.2 F (36.8 C) (Oral)   Resp 16   Ht 5' 8.5" (1.74 m)   Wt 228 lb 2 oz (  103.5 kg)   SpO2 97%   BMI 34.18 kg/m   Labs: Comments: Blood-Negative, Protein-Negatve, Glucose-Negative, SG->1.030  Assessment:    Healthy male exam.  Meets standards in 56 CFR  391.41;  qualifies for 2 year certificate.    Plan:    Medical examiners certificate completed and printed. Return as needed.    Marvin Boston, MS, PA-C 9:58 AM, 04/04/2017

## 2017-04-07 ENCOUNTER — Encounter: Payer: PRIVATE HEALTH INSURANCE | Admitting: Physician Assistant

## 2017-06-01 ENCOUNTER — Encounter: Payer: Self-pay | Admitting: Physician Assistant

## 2017-11-06 ENCOUNTER — Encounter: Payer: PRIVATE HEALTH INSURANCE | Admitting: Urgent Care

## 2017-11-13 ENCOUNTER — Encounter: Payer: PRIVATE HEALTH INSURANCE | Admitting: Urgent Care

## 2018-02-03 ENCOUNTER — Ambulatory Visit: Payer: Self-pay

## 2018-02-03 NOTE — Telephone Encounter (Signed)
Pt called stating that his family now has the flu.  He was seen at urgent care and swabbed positive last week.He took tamiflu for 5 days. He wanted another RX to use prophylactically. Pt was advised to use good hand hygiene and report any symptoms immediately. Pt was positive last week and has no symptoms now and was advised that he should be ok. He was educated about the use of tamaflu. Reason for Disposition . Caller has medication question only, adult not sick, and triager answers question  Answer Assessment - Initial Assessment Questions 1. SYMPTOMS: "Do you have any symptoms?"     No treated last week with 5 days of tamiflu, but family now has flu 2. SEVERITY: If symptoms are present, ask "Are they mild, moderate or severe?"     No symptoms  Protocols used: MEDICATION QUESTION CALL-A-AH

## 2019-05-09 ENCOUNTER — Other Ambulatory Visit: Payer: Self-pay

## 2019-05-09 ENCOUNTER — Ambulatory Visit (INDEPENDENT_AMBULATORY_CARE_PROVIDER_SITE_OTHER): Payer: PRIVATE HEALTH INSURANCE | Admitting: Family Medicine

## 2019-05-09 ENCOUNTER — Encounter: Payer: Self-pay | Admitting: Family Medicine

## 2019-05-09 VITALS — BP 121/82 | HR 77 | Temp 98.3°F | Ht 68.5 in | Wt 235.0 lb

## 2019-05-09 DIAGNOSIS — Z024 Encounter for examination for driving license: Secondary | ICD-10-CM

## 2019-05-09 NOTE — Progress Notes (Signed)
Subjective:  Patient ID: Marvin Dean, male    DOB: 10-May-1990  Age: 29 y.o. MRN: [id number]  CC:  Chief Complaint  Patient presents with  . DOT    pt states he feels good today with no complaints.    HPI Marvin Dean presents for   DOT physical Last physical reviewed from February 2019.  No specific concerns at that time, 2-year certificate provided.  History of HLD. rx lipitor, not on meds as prior elevated readings improved when fasting.   No HTN, no hx of OSA, snoring, no daytime somnolence.   No CP or dyspnea with exertion, no known hx of heart disease.  No msk weakness or deficit.    History Patient Active Problem List   Diagnosis Date Noted  . Pure hypercholesterolemia 09/06/2016  . Elevated BP without diagnosis of hypertension 09/06/2016   No past medical history on file. No past surgical history on file. No Known Allergies Prior to Admission medications   Medication Sig Start Date End Date Taking? Authorizing Provider  atorvastatin (LIPITOR) 40 MG tablet Take 1 tablet (40 mg total) by mouth daily. 12/15/16   Gale Journey Damaris Hippo, PA-C   Social History   Socioeconomic History  . Marital status: Single    Spouse name: Not on file  . Number of children: Not on file  . Years of education: Not on file  . Highest education level: Not on file  Occupational History  . Occupation: Dealer    Comment: Chief Financial Officer  Tobacco Use  . Smoking status: Never Smoker  . Smokeless tobacco: Never Used  Substance and Sexual Activity  . Alcohol use: Yes    Comment: 2 drinks a week  . Drug use: No  . Sexual activity: Yes    Partners: Female    Birth control/protection: Condom  Other Topics Concern  . Not on file  Social History Narrative   Lives alone   Plays sfotball 2x/week   Seatbelt - most of the time      Guns - secured   Social Determinants of Health   Financial Resource Strain:   . Difficulty of Paying Living Expenses: Not on file  Food Insecurity:     . Worried About Charity fundraiser in the Last Year: Not on file  . Ran Out of Food in the Last Year: Not on file  Transportation Needs:   . Lack of Transportation (Medical): Not on file  . Lack of Transportation (Non-Medical): Not on file  Physical Activity:   . Days of Exercise per Week: Not on file  . Minutes of Exercise per Session: Not on file  Stress:   . Feeling of Stress : Not on file  Social Connections:   . Frequency of Communication with Friends and Family: Not on file  . Frequency of Social Gatherings with Friends and Family: Not on file  . Attends Religious Services: Not on file  . Active Member of Clubs or Organizations: Not on file  . Attends Archivist Meetings: Not on file  . Marital Status: Not on file  Intimate Partner Violence:   . Fear of Current or Ex-Partner: Not on file  . Emotionally Abused: Not on file  . Physically Abused: Not on file  . Sexually Abused: Not on file    Review of Systems Per DOT form 32 questions - all negative.   Objective:   Vitals:   05/09/19 0826  BP: 121/82  Pulse: 77  Temp: 98.3 F (36.8  C)  TempSrc: Temporal  SpO2: 98%  Weight: 235 lb (106.6 kg)  Height: 5' 8.5" (1.74 m)    Physical Exam     Assessment & Plan:  Marvin Dean is a 29 y.o. male . Encounter for commercial driver medical examination (CDME)  - 2 year card provided. See form. No perceived restrictions.   - recommended follow up with PCP re: HLD and repeat testing.   No orders of the defined types were placed in this encounter.  Patient Instructions       If you have lab work done today you will be contacted with your lab results within the next 2 weeks.  If you have not heard from [place] then please contact us. The fastest way to get your results is to register for My Chart.   IF you received an x-ray today, you will receive an invoice from [facility]. Please contact [facility] at [phone] with questions or  concerns regarding your invoice.   IF you received labwork today, you will receive an invoice from [facility]. Please contact [facility] at [phone] with questions or concerns regarding your invoice.   Our billing staff will not be able to assist you with questions regarding bills from these companies.  You will be contacted with the lab results as soon as they are available. The fastest way to get your results is to activate your My Chart account. Instructions are located on the last page of this paperwork. If you have not heard from [place] regarding the results in 2 weeks, please contact this office.         Signed, Meredith Staggers, MD [facility]

## 2019-05-09 NOTE — Patient Instructions (Signed)
° ° ° °  If you have lab work done today you will be contacted with your lab results within the next 2 weeks.  If you have not heard from us then please contact us. The fastest way to get your results is to register for My Chart. ° ° °IF you received an x-ray today, you will receive an invoice from [facility]. Please contact [facility] at [phone] with questions or concerns regarding your invoice.  ° °IF you received labwork today, you will receive an invoice from [facility]. Please contact [facility] at [phone] with questions or concerns regarding your invoice.  ° °Our billing staff will not be able to assist you with questions regarding bills from these companies. ° °You will be contacted with the lab results as soon as they are available. The fastest way to get your results is to activate your My Chart account. Instructions are located on the last page of this paperwork. If you have not heard from us regarding the results in 2 weeks, please contact this office. °  ° ° ° °

## 2019-05-26 ENCOUNTER — Other Ambulatory Visit (HOSPITAL_COMMUNITY)
Admission: RE | Admit: 2019-05-26 | Discharge: 2019-05-26 | Disposition: A | Discharge status: Home / Self Care | Payer: No Typology Code available for payment source | Source: Ambulatory Visit | Attending: Family Medicine | Admitting: Family Medicine

## 2019-05-26 ENCOUNTER — Other Ambulatory Visit: Payer: Self-pay

## 2019-05-26 ENCOUNTER — Encounter: Payer: Self-pay | Admitting: Family Medicine

## 2019-05-26 ENCOUNTER — Telehealth: Payer: Self-pay | Admitting: Family Medicine

## 2019-05-26 ENCOUNTER — Ambulatory Visit (INDEPENDENT_AMBULATORY_CARE_PROVIDER_SITE_OTHER): Payer: PRIVATE HEALTH INSURANCE | Admitting: Family Medicine

## 2019-05-26 VITALS — BP 138/81 | HR 60 | Temp 98.0°F | Ht 68.0 in | Wt 227.0 lb

## 2019-05-26 DIAGNOSIS — Z13220 Encounter for screening for lipoid disorders: Secondary | ICD-10-CM | POA: Diagnosis not present

## 2019-05-26 DIAGNOSIS — L259 Unspecified contact dermatitis, unspecified cause: Secondary | ICD-10-CM

## 2019-05-26 DIAGNOSIS — Z0000 Encounter for general adult medical examination without abnormal findings: Secondary | ICD-10-CM

## 2019-05-26 DIAGNOSIS — Z0001 Encounter for general adult medical examination with abnormal findings: Secondary | ICD-10-CM | POA: Diagnosis not present

## 2019-05-26 DIAGNOSIS — Z131 Encounter for screening for diabetes mellitus: Secondary | ICD-10-CM | POA: Diagnosis not present

## 2019-05-26 DIAGNOSIS — Z113 Encounter for screening for infections with a predominantly sexual mode of transmission: Secondary | ICD-10-CM | POA: Diagnosis not present

## 2019-05-26 MED ORDER — TRIAMCINOLONE ACETONIDE 0.1 % EX CREA: 1.0000 "application " | TOPICAL_CREAM | Freq: Two times a day (BID) | CUTANEOUS | 0 refills | Status: DC

## 2019-05-26 NOTE — Patient Instructions (Addendum)
Let me know if questions about vaccine.  COVID-19 Vaccine Information can be found at: [email or web address] For questions related to vaccine distribution or appointments, please email [email or web address] or call [phone].   Irritation on neck is likely a contact dermatitis.  See information below.  For now can apply the steroid cream up to twice daily as needed, Aveeno lotion as needed.  If not improving follow-up so we can look at that further.  I will check some blood work as we discussed including cholesterol and can discuss need for medication once I see the results.  Keep up the good work with diet and exercise.  Return to the clinic or go to the nearest emergency room if any of your symptoms worsen or new symptoms occur.   Contact Dermatitis Dermatitis is redness, soreness, and swelling (inflammation) of the skin. Contact dermatitis is a reaction to certain substances that touch the skin. Many different substances can cause contact dermatitis. There are two types of contact dermatitis:  Irritant contact dermatitis. This type is caused by something that irritates your skin, such as having dry hands from washing them too often with soap. This type does not require previous exposure to the substance for a reaction to occur. This is the most common type.  Allergic contact dermatitis. This type is caused by a substance that you are allergic to, such as poison ivy. This type occurs when you have been exposed to the substance (allergen) and develop a sensitivity to it. Dermatitis may develop soon after your first exposure to the allergen, or it may not develop until the next time you are exposed and every time thereafter. What are the causes? Irritant contact dermatitis is most commonly caused by exposure to:  Makeup.  Soaps.  Detergents.  Bleaches.  Acids.  Metal salts, such as nickel. Allergic contact dermatitis is most  commonly caused by exposure to:  Poisonous plants.  Chemicals.  Jewelry.  Latex.  Medicines.  Preservatives in products, such as clothing. What increases the risk? You are more likely to develop this condition if you have:  A job that exposes you to irritants or allergens.  Certain medical conditions, such as asthma or eczema. What are the signs or symptoms? Symptoms of this condition may occur on your body anywhere the irritant has touched you or is touched by you.  Symptoms include: ? Dryness or flaking. ? Redness. ? Cracks. ? Itching. ? Pain or a burning feeling. ? Blisters. ? Drainage of small amounts of blood or clear fluid from skin cracks. With allergic contact dermatitis, there may also be swelling in areas such as the eyelids, mouth, or genitals. How is this diagnosed? This condition is diagnosed with a medical history and physical exam.  A patch skin test may be performed to help determine the cause.  If the condition is related to your job, you may need to see an occupational medicine specialist. How is this treated? This condition is treated by checking for the cause of the reaction and protecting your skin from further contact. Treatment may also include:  Steroid creams or ointments. Oral steroid medicines may be needed in more severe cases.  Antibiotic medicines or antibacterial ointments, if a skin infection is present.  Antihistamine lotion or an antihistamine taken by mouth to ease itching.  A bandage (dressing). Follow these instructions at home: Skin care  Moisturize your skin as needed.  Apply cool compresses to the affected areas.  Try applying baking soda paste to your skin. Stir water  into baking soda until it reaches a paste-like consistency.  Do not scratch your skin, and avoid friction to the affected area.  Avoid the use of soaps, perfumes, and dyes. Medicines  Take or apply over-the-counter and prescription medicines only as  told by your health care provider.  If you were prescribed an antibiotic medicine, take or apply the antibiotic as told by your health care provider. Do not stop using the antibiotic even if your condition improves. Bathing  Try taking a bath with: ? Epsom salts. Follow the instructions on the packaging. You can get these at your local pharmacy or grocery store. ? Baking soda. Pour a small amount into the bath as directed by your health care provider. ? Colloidal oatmeal. Follow the instructions on the packaging. You can get this at your local pharmacy or grocery store.  Bathe less frequently, such as every other day.  Bathe in lukewarm water. Avoid using hot water. Bandage care  If you were given a bandage (dressing), change it as told by your health care provider.  Wash your hands with soap and water before and after you change your dressing. If soap and water are not available, use hand sanitizer. General instructions  Avoid the substance that caused your reaction. If you do not know what caused it, keep a journal to try to track what caused it. Write down: ? What you eat. ? What cosmetic products you use. ? What you drink. ? What you wear in the affected area. This includes jewelry.  Check the affected areas every day for signs of infection. Check for: ? More redness, swelling, or pain. ? More fluid or blood. ? Warmth. ? Pus or a bad smell.  Keep all follow-up visits as told by your health care provider. This is important. Contact a health care provider if:  Your condition does not improve with treatment.  Your condition gets worse.  You have signs of infection such as swelling, tenderness, redness, soreness, or warmth in the affected area.  You have a fever.  You have new symptoms. Get help right away if:  You have a severe headache, neck pain, or neck stiffness.  You vomit.  You feel very sleepy.  You notice red streaks coming from the affected area.  Your  bone or joint underneath the affected area becomes painful after the skin has healed.  The affected area turns darker.  You have difficulty breathing. Summary  Dermatitis is redness, soreness, and swelling (inflammation) of the skin. Contact dermatitis is a reaction to certain substances that touch the skin.  Symptoms of this condition may occur on your body anywhere the irritant has touched you or is touched by you.  This condition is treated by figuring out what caused the reaction and protecting your skin from further contact. Treatment may also include medicines and skin care.  Avoid the substance that caused your reaction. If you do not know what caused it, keep a journal to try to track what caused it.  Contact a health care provider if your condition gets worse or you have signs of infection such as swelling, tenderness, redness, soreness, or warmth in the affected area. This information is not intended to replace advice given to you by your health care provider. Make sure you discuss any questions you have with your health care provider. Document Revised: 06/09/2018 Document Reviewed: 09/02/2017 Elsevier Patient Education  2020 [place] you healthy  Get these tests  Blood pressure- Have your  blood pressure checked once a year by your healthcare provider.  Normal blood pressure is 120/80.  Weight- Have your body mass index (BMI) calculated to screen for obesity.  BMI is a measure of body fat based on height and weight. You can also calculate your own BMI at https://www.west-esparza.com/.  Cholesterol- Have your cholesterol checked regularly starting at age 17, sooner may be necessary if you have diabetes, high blood pressure, if a family member developed heart diseases at an early age or if you smoke.   Chlamydia, HIV, and other sexual transmitted disease- Get screened each year until the age of 20 then within three months of each new sexual partner.  Diabetes-  Have your blood sugar checked regularly if you have high blood pressure, high cholesterol, a family history of diabetes or if you are overweight.  Get these vaccines  Flu shot- Every fall.  Tetanus shot- Every 10 years.  Menactra- Single dose; prevents meningitis.  Take these steps  Don't smoke- If you do smoke, ask your healthcare provider about quitting. For tips on how to quit, go to www.smokefree.gov or call 1-800-QUIT-NOW.  Be physically active- Exercise 5 days a week for at least 30 minutes.  If you are not already physically active start slow and gradually work up to 30 minutes of moderate physical activity.  Examples of moderate activity include walking briskly, mowing the yard, dancing, swimming bicycling, etc.  Eat a healthy diet- Eat a variety of healthy foods such as fruits, vegetables, low fat milk, low fat cheese, yogurt, lean meats, poultry, fish, beans, tofu, etc.  For more information on healthy eating, go to www.thenutritionsource.org  Drink alcohol in moderation- Limit alcohol intake two drinks or less a day.  Never drink and drive.  Dentist- Brush and floss teeth twice daily; visit your dentis twice a year.  Depression-Your emotional health is as important as your physical health.  If you're feeling down, losing interest in things you normally enjoy please talk with your healthcare provider.  Gun Safety- If you keep a gun in your home, keep it unloaded and with the safety lock on.  Bullets should be stored separately.  Helmet use- Always wear a helmet when riding a motorcycle, bicycle, rollerblading or skateboarding.  Safe sex- If you may be exposed to a sexually transmitted infection, use a condom  Seat belts- Seat bels can save your life; always wear one.  Smoke/Carbon Monoxide detectors- These detectors need to be installed on the appropriate level of your home.  Replace batteries at least once a year.  Skin Cancer- When out in the sun, cover up and use  sunscreen SPF 15 or higher.  Violence- If anyone is threatening or hurting you, please tell your healthcare provider.   If you have lab work done today you will be contacted with your lab results within the next 2 weeks.  If you have not heard from [place] then please contact us. The fastest way to get your results is to register for My Chart.   IF you received an x-ray today, you will receive an invoice from [facility]. Please contact [facility] at [phone] with questions or concerns regarding your invoice.   IF you received labwork today, you will receive an invoice from [facility]. Please contact [facility] at [phone] with questions or concerns regarding your invoice.   Our billing staff will not be able to assist you with questions regarding bills from these companies.  You will be contacted with the lab results as  soon as they are available. The fastest way to get your results is to activate your My Chart account. Instructions are located on the last page of this paperwork. If you have not heard from [place] regarding the results in 2 weeks, please contact this office.

## 2019-05-26 NOTE — Progress Notes (Signed)
Subjective:  Patient ID: Marvin Dean, male    DOB: 16-Jun-1990  Age: 29 y.o. MRN: [id number]  CC:  Chief Complaint  Patient presents with  . Establish Care    pt states he feels good today with no complaints.     HPI Marvin Dean presents for  new patient to establish care. Recently had DOT physical.    Hyperlipidemia: Previous history of hyperlipidemia, Lipitor has been prescribed previously but prior elevated readings apparently had improved with fasting.  No recent testing. Unknown family history.  Lab Results  Component Value Date   CHOL 255 (H) 12/06/2016   HDL 35 (L) 12/06/2016   LDLCALC 193 (H) 12/06/2016   TRIG 133 12/06/2016   CHOLHDL 7.3 (H) 12/06/2016   Lab Results  Component Value Date   ALT 41 12/06/2016   AST 16 12/06/2016   ALKPHOS 141 (H) 12/06/2016   BILITOT 0.7 12/06/2016    BP Readings from Last 3 Encounters:  05/26/19 138/81  05/09/19 121/82  04/04/17 130/82   cancer screening: mom with COPD, lung CA - smoker. No other family history known.   Immunization History  Administered Date(s) Administered  . Td 03/07/2012  . Tdap 06/12/2012  Covid vaccine plans: not planning on it. Girlfriend who is a Engineer, civil (consulting) had vaccine.  71,6,5,2 yo kids - all girls.  No flu vaccine.   Obesity: Normal glucose in 2018. Usual weight around 230. Trying to eat healthier, portion control.  Exercise 5 days per week - 40 min. HIIT training. No CP with exercise.  Wt Readings from Last 3 Encounters:  05/26/19 227 lb (103 kg)  05/09/19 235 lb (106.6 kg)  04/04/17 228 lb 2 oz (103.5 kg)   Body mass index is 34.52 kg/m.  STI testing - monogamous with GF. no symptoms.    Rash on neck- past few weeks, after new cologne.   History Patient Active Problem List   Diagnosis Date Noted  . Pure hypercholesterolemia 09/06/2016  . Elevated BP without diagnosis of hypertension 09/06/2016   No past medical history on file. No past surgical history on file. No Known  Allergies Prior to Admission medications   Medication Sig Start Date End Date Taking? Authorizing Provider  atorvastatin (LIPITOR) 40 MG tablet Take 1 tablet (40 mg total) by mouth daily. Patient not taking: Reported on 05/09/2019 12/15/16   Morrell Riddle, PA-C   Social History   Socioeconomic History  . Marital status: Single    Spouse name: Not on file  . Number of children: Not on file  . Years of education: Not on file  . Highest education level: Not on file  Occupational History  . Occupation: Curator    Comment: Astronomer  Tobacco Use  . Smoking status: Never Smoker  . Smokeless tobacco: Never Used  Substance and Sexual Activity  . Alcohol use: Not Currently    Comment: 2 drinks a week  . Drug use: No  . Sexual activity: Yes    Partners: Female    Birth control/protection: None  Other Topics Concern  . Not on file  Social History Narrative   Lives alone   Plays sfotball 2x/week   Seatbelt - most of the time      Guns - secured   Social Determinants of Health   Financial Resource Strain:   . Difficulty of Paying Living Expenses:   Food Insecurity:   . Worried About Programme researcher, broadcasting/film/video in the Last Year:   . [employer]  of Food in the Last Year:   Transportation Needs:   . Freight forwarder (Medical):   Marland Kitchen Lack of Transportation (Non-Medical):   Physical Activity:   . Days of Exercise per Week:   . Minutes of Exercise per Session:   Stress:   . Feeling of Stress :   Social Connections:   . Frequency of Communication with Friends and Family:   . Frequency of Social Gatherings with Friends and Family:   . Attends Religious Services:   . Active Member of Clubs or Organizations:   . Attends Banker Meetings:   Marland Kitchen Marital Status:   Intimate Partner Violence:   . Fear of Current or Ex-Partner:   . Emotionally Abused:   Marland Kitchen Physically Abused:   . Sexually Abused:     Review of Systems 13 point review of systems per patient health survey  noted.  Negative other than as indicated above or in HPI.    Objective:   Vitals:   05/26/19 1337  BP: 138/81  Pulse: 60  Temp: 98 F (36.7 C)  TempSrc: Temporal  SpO2: 98%  Weight: 227 lb (103 kg)  Height: 5\' 8"  (1.727 m)     Physical Exam Vitals reviewed.  Constitutional:      Appearance: He is well-developed.  HENT:     Head: Normocephalic and atraumatic.      Right Ear: External ear normal.     Left Ear: External ear normal.  Eyes:     Conjunctiva/sclera: Conjunctivae normal.     Pupils: Pupils are equal, round, and reactive to light.  Neck:     Thyroid: No thyromegaly.  Cardiovascular:     Rate and Rhythm: Normal rate and regular rhythm.     Heart sounds: Normal heart sounds.  Pulmonary:     Effort: Pulmonary effort is normal. No respiratory distress.     Breath sounds: Normal breath sounds. No wheezing.  Abdominal:     General: There is no distension.     Palpations: Abdomen is soft.     Tenderness: There is no abdominal tenderness.  Musculoskeletal:        General: No tenderness. Normal range of motion.     Cervical back: Normal range of motion and neck supple.  Lymphadenopathy:     Cervical: No cervical adenopathy.  Skin:    General: Skin is warm and dry.  Neurological:     Mental Status: He is alert and oriented to person, place, and time.     Deep Tendon Reflexes: Reflexes are normal and symmetric.  Psychiatric:        Behavior: Behavior normal.     Assessment & Plan:  Marvin Dean is a 29 y.o. male . Annual physical exam  - -anticipatory guidance as below in AVS, screening labs above. Health maintenance items as above in HPI discussed/recommended as applicable.   Screening for diabetes mellitus - Plan: Hemoglobin A1c  Screening for hypercholesterolemia - Plan: Lipid panel, Comprehensive metabolic panel  -Prior significant hyperlipidemia.  If still elevated at same range, suspect familial component.  Repeat labs, potential need for statin  discussed.  Routine screening for STI (sexually transmitted infection) - Plan: HIV Antibody (routine testing w rflx), RPR, GC/Chlamydia probe amp ([place])not at [facility]  - asx, check testing above.   Contact dermatitis, unspecified contact dermatitis type, unspecified trigger - Plan: triamcinolone cream (KENALOG) 0.1 %  -Possibly due to new cologne, symptomatic care with Aveeno, triamcinolone short-term as needed, RTC precautions.  No orders of the defined types were placed in this encounter.  Patient Instructions      If you have lab work done today you will be contacted with your lab results within the next 2 weeks.  If you have not heard from [place] then please contact us. The fastest way to get your results is to register for My Chart.   IF you received an x-ray today, you will receive an invoice from [facility]. Please contact [facility] at [phone] with questions or concerns regarding your invoice.   IF you received labwork today, you will receive an invoice from [place]. Please contact [facility] at [phone] with questions or concerns regarding your invoice.   Our billing staff will not be able to assist you with questions regarding bills from these companies.  You will be contacted with the lab results as soon as they are available. The fastest way to get your results is to activate your My Chart account. Instructions are located on the last page of this paperwork. If you have not heard from [place] regarding the results in 2 weeks, please contact this office.         Signed, Merri Ray, MD [facility]

## 2019-05-26 NOTE — Telephone Encounter (Signed)
Called to verify eligibility for date of service .

## 2019-05-27 LAB — COMPREHENSIVE METABOLIC PANEL
ALT: 40 IU/L (ref 0–44)
AST: 17 IU/L (ref 0–40)
Albumin/Globulin Ratio: 1.8 (ref 1.2–2.2)
Albumin: 4.9 g/dL (ref 4.1–5.2)
Alkaline Phosphatase: 118 IU/L — ABNORMAL HIGH (ref 39–117)
BUN/Creatinine Ratio: 11 (ref 9–20)
BUN: 10 mg/dL (ref 6–20)
Bilirubin Total: 0.8 mg/dL (ref 0.0–1.2)
CO2: 25 mmol/L (ref 20–29)
Calcium: 9.9 mg/dL (ref 8.7–10.2)
Chloride: 103 mmol/L (ref 96–106)
Creatinine, Ser: 0.92 mg/dL (ref 0.76–1.27)
GFR calc Af Amer: 129 mL/min/{1.73_m2} (ref >59)
GFR calc non Af Amer: 111 mL/min/{1.73_m2} (ref >59)
Globulin, Total: 2.8 g/dL (ref 1.5–4.5)
Glucose: 73 mg/dL (ref 65–99)
Potassium: 4.6 mmol/L (ref 3.5–5.2)
Sodium: 142 mmol/L (ref 134–144)
Total Protein: 7.7 g/dL (ref 6.0–8.5)

## 2019-05-27 LAB — HEMOGLOBIN A1C
Est. average glucose Bld gHb Est-mCnc: 105 mg/dL
Hgb A1c MFr Bld: 5.3 % (ref 4.8–5.6)

## 2019-05-27 LAB — RPR: RPR Ser Ql: NONREACTIVE

## 2019-05-27 LAB — GC/CHLAMYDIA PROBE AMP (~~LOC~~) NOT AT ARMC
Chlamydia: NEGATIVE
Comment: NEGATIVE
Comment: NORMAL
Neisseria Gonorrhea: NEGATIVE

## 2019-05-27 LAB — LIPID PANEL
Chol/HDL Ratio: 6.1 ratio — ABNORMAL HIGH (ref 0.0–5.0)
Cholesterol, Total: 226 mg/dL — ABNORMAL HIGH (ref 100–199)
HDL: 37 mg/dL — ABNORMAL LOW (ref >39)
LDL Chol Calc (NIH): 175 mg/dL — ABNORMAL HIGH (ref 0–99)
Triglycerides: 77 mg/dL (ref 0–149)
VLDL Cholesterol Cal: 14 mg/dL (ref 5–40)

## 2019-05-27 LAB — HIV ANTIBODY (ROUTINE TESTING W REFLEX): HIV Screen 4th Generation wRfx: NONREACTIVE

## 2019-05-30 ENCOUNTER — Encounter: Payer: Self-pay | Admitting: Radiology

## 2020-06-18 ENCOUNTER — Other Ambulatory Visit: Payer: Self-pay

## 2020-06-18 ENCOUNTER — Encounter (HOSPITAL_COMMUNITY): Payer: Self-pay

## 2020-06-18 ENCOUNTER — Ambulatory Visit (HOSPITAL_COMMUNITY)
Admission: EM | Admit: 2020-06-18 | Discharge: 2020-06-18 | Disposition: A | Discharge status: Home / Self Care | Payer: 59 | Attending: Physician Assistant | Admitting: Physician Assistant

## 2020-06-18 DIAGNOSIS — J029 Acute pharyngitis, unspecified: Secondary | ICD-10-CM | POA: Diagnosis not present

## 2020-06-18 DIAGNOSIS — R0981 Nasal congestion: Secondary | ICD-10-CM | POA: Insufficient documentation

## 2020-06-18 DIAGNOSIS — J069 Acute upper respiratory infection, unspecified: Secondary | ICD-10-CM | POA: Diagnosis not present

## 2020-06-18 DIAGNOSIS — R059 Cough, unspecified: Secondary | ICD-10-CM | POA: Diagnosis not present

## 2020-06-18 DIAGNOSIS — Z28311 Partially vaccinated for covid-19: Secondary | ICD-10-CM | POA: Insufficient documentation

## 2020-06-18 DIAGNOSIS — Z20822 Contact with and (suspected) exposure to covid-19: Secondary | ICD-10-CM | POA: Diagnosis not present

## 2020-06-18 DIAGNOSIS — R051 Acute cough: Secondary | ICD-10-CM | POA: Diagnosis not present

## 2020-06-18 LAB — SARS CORONAVIRUS 2 (TAT 6-24 HRS): SARS Coronavirus 2: NEGATIVE

## 2020-06-18 LAB — POC INFLUENZA A AND B ANTIGEN (URGENT CARE ONLY)
Influenza A Ag: NEGATIVE
Influenza B Ag: NEGATIVE

## 2020-06-18 LAB — POCT RAPID STREP A, ED / UC: Streptococcus, Group A Screen (Direct): NEGATIVE

## 2020-06-18 MED ORDER — PREDNISONE 20 MG PO TABS: 40.0000 mg | ORAL_TABLET | Freq: Every day | ORAL | 0 refills | Status: AC

## 2020-06-18 NOTE — ED Triage Notes (Signed)
Pt in with c/o ST and cough x 4 days  Pt has been taking theraflu and ibuprofen for sxs

## 2020-06-18 NOTE — ED Provider Notes (Signed)
[facility]    CSN: [id number] Arrival date & time: 06/18/20  0827      History   Chief Complaint Chief Complaint  Patient presents with  . Sore Throat    HPI Marvin Dean is a 30 y.o. male.   Patient presents today with a 4-day history of sore throat and cough.  Reports cough is productive with thick colored sputum.  Sore throat pain is rated 8 on a 0-10 pain scale, localized to posterior oropharynx without radiation, described as aching, no aggravating or alleviating factors identified.  Patient has tried ibuprofen with improvement but not resolution of symptoms.  Reports sore throat is worse with coughing and swallowing.  He denies any dysphagia, muffled voice, shortness of breath.  Denies any fever, nausea, vomiting.  He has had COVID-19 vaccines but has not had booster.  He has not had a flu shot.  He does report daughter was sick with similar symptoms prior to his symptom onset.  He has not tried additional medications outside of ibuprofen for symptom relief.  Denies history of allergies, asthma, COPD.     History reviewed. No pertinent past medical history.  Patient Active Problem List   Diagnosis Date Noted  . Pure hypercholesterolemia 09/06/2016  . Elevated BP without diagnosis of hypertension 09/06/2016    History reviewed. No pertinent surgical history.     Home Medications    Prior to Admission medications   Medication Sig Start Date End Date Taking? Authorizing Provider  predniSONE (DELTASONE) 20 MG tablet Take 2 tablets (40 mg total) by mouth daily with breakfast for 4 days. 06/18/20 06/22/20 Yes Calise Dunckel K, PA-C  atorvastatin (LIPITOR) 40 MG tablet Take 1 tablet (40 mg total) by mouth daily. Patient not taking: Reported on 05/09/2019 12/15/16   Morrell Riddle, PA-C  triamcinolone cream (KENALOG) 0.1 % Apply 1 application topically 2 (two) times daily. 05/26/19   Shade Flood, MD    Family History Family History  Problem Relation Age of  Onset  . COPD Mother   . Lung cancer Mother     Social History Social History   Tobacco Use  . Smoking status: Never Smoker  . Smokeless tobacco: Never Used  Vaping Use  . Vaping Use: Never used  Substance Use Topics  . Alcohol use: Not Currently    Comment: 2 drinks a week  . Drug use: No     Allergies   Patient has no known allergies.   Review of Systems Review of Systems  Constitutional: Negative for activity change, appetite change, fatigue and fever.  HENT: Positive for congestion, postnasal drip, sinus pressure and sore throat. Negative for sneezing, trouble swallowing and voice change.   Respiratory: Positive for cough. Negative for shortness of breath.   Cardiovascular: Negative for chest pain.  Gastrointestinal: Negative for abdominal pain, diarrhea, nausea and vomiting.  Musculoskeletal: Negative for arthralgias and myalgias.  Neurological: Negative for dizziness, light-headedness and headaches.     Physical Exam Triage Vital Signs ED Triage Vitals  Enc Vitals Group     BP 06/18/20 0845 (!) 157/113     Pulse Rate 06/18/20 0845 80     Resp 06/18/20 0845 18     Temp 06/18/20 0845 98.4 F (36.9 C)     Temp src --      SpO2 06/18/20 0845 97 %     Weight --      Height --      Head Circumference --  Peak Flow --      Pain Score 06/18/20 0844 7     Pain Loc --      Pain Edu? --      Excl. in GC? --    No data found.  Updated Vital Signs BP (!) 157/113   Pulse 80   Temp 98.4 F (36.9 C)   Resp 18   SpO2 97%   Visual Acuity Right Eye Distance:   Left Eye Distance:   Bilateral Distance:    Right Eye Near:   Left Eye Near:    Bilateral Near:     Physical Exam Vitals reviewed.  Constitutional:      General: He is awake.     Appearance: Normal appearance. He is normal weight. He is not ill-appearing.     Comments: Very pleasant male appears stated age in no acute distress  HENT:     Head: Normocephalic and atraumatic.     Right Ear:  Tympanic membrane, ear canal and external ear normal. Tympanic membrane is not erythematous or bulging.     Left Ear: Tympanic membrane, ear canal and external ear normal. There is impacted cerumen. Tympanic membrane is not erythematous or bulging.     Nose:     Right Sinus: Maxillary sinus tenderness present.     Left Sinus: Maxillary sinus tenderness present.     Mouth/Throat:     Pharynx: Uvula midline. Posterior oropharyngeal erythema present. No oropharyngeal exudate or uvula swelling.     Tonsils: No tonsillar exudate or tonsillar abscesses. 2+ on the right. 2+ on the left.     Comments: Moderate erythema and drainage in posterior oropharynx Cardiovascular:     Rate and Rhythm: Normal rate and regular rhythm.     Heart sounds: No murmur heard.   Pulmonary:     Effort: Pulmonary effort is normal. No accessory muscle usage or respiratory distress.     Breath sounds: Normal breath sounds. No stridor. No wheezing, rhonchi or rales.     Comments: Clear to auscultation bilaterally Lymphadenopathy:     Head:     Right side of head: No submental, submandibular or tonsillar adenopathy.     Left side of head: No submental, submandibular or tonsillar adenopathy.     Cervical: No cervical adenopathy.  Neurological:     Mental Status: He is alert.  Psychiatric:        Behavior: Behavior is cooperative.      UC Treatments / Results  Labs (all labs ordered are listed, but only abnormal results are displayed) Labs Reviewed  SARS CORONAVIRUS 2 (TAT 6-24 HRS)  CULTURE, GROUP A STREP [facility])  POCT RAPID STREP A, ED / UC  POC INFLUENZA A AND B ANTIGEN (URGENT CARE ONLY)    EKG   Radiology No results found.  Procedures Procedures (including critical care time)  Medications Ordered in UC Medications - No data to display  Initial Impression / Assessment and Plan / UC Course  I have reviewed the triage vital signs and the nursing notes.  Pertinent labs & imaging results that were  available during my care of the patient were reviewed by me and considered in my medical decision making (see chart for details).     Centor score of 1.  Rapid strep negative in office today and culture was obtained and sent.  Flu testing was negative.  COVID-19 testing is pending.  Given short duration of symptoms no indication for antibiotic use.  Patient was prescribed prednisone  burst (40 mg x 4 days) with instruction not to take NSAIDs including aspirin, ibuprofen/Advil, naproxen/Aleve with this medication she concern for GI bleeding.  Strict return precautions given to which patient expressed understanding.  Final Clinical Impressions(s) / UC Diagnoses   Final diagnoses:  Sore throat  Nasal congestion  Cough  Upper respiratory tract infection, unspecified type     Discharge Instructions     Take prednisone to help with inflammation.  He should not take any NSAIDs (aspirin, ibuprofen/Advil, naproxen/Aleve) with this medication.  You can use acetaminophen/Tylenol.  Make sure that you are drinking plenty of fluid.  We will be in touch with your Covid results soon as we have them.  If anything worsens please return for further evaluation.    ED Prescriptions    Medication Sig Dispense Auth. Provider   predniSONE (DELTASONE) 20 MG tablet Take 2 tablets (40 mg total) by mouth daily with breakfast for 4 days. 8 tablet Londan Coplen, Noberto Retort, PA-C     PDMP not reviewed this encounter.   Jeani Hawking, PA-C 06/18/20 1006

## 2020-06-18 NOTE — Discharge Instructions (Addendum)
Take prednisone to help with inflammation.  He should not take any NSAIDs (aspirin, ibuprofen/Advil, naproxen/Aleve) with this medication.  You can use acetaminophen/Tylenol.  Make sure that you are drinking plenty of fluid.  We will be in touch with your Covid results soon as we have them.  If anything worsens please return for further evaluation.

## 2020-06-20 ENCOUNTER — Other Ambulatory Visit: Payer: Self-pay

## 2020-06-20 ENCOUNTER — Ambulatory Visit
Admission: EM | Admit: 2020-06-20 | Discharge: 2020-06-20 | Disposition: A | Discharge status: Home / Self Care | Payer: 59 | Attending: Emergency Medicine | Admitting: Emergency Medicine

## 2020-06-20 DIAGNOSIS — J019 Acute sinusitis, unspecified: Secondary | ICD-10-CM

## 2020-06-20 DIAGNOSIS — J029 Acute pharyngitis, unspecified: Secondary | ICD-10-CM | POA: Diagnosis not present

## 2020-06-20 LAB — CULTURE, GROUP A STREP (THRC)

## 2020-06-20 MED ORDER — CETIRIZINE HCL 10 MG PO CAPS: 10.0000 mg | ORAL_CAPSULE | Freq: Every day | ORAL | 0 refills | Status: DC

## 2020-06-20 MED ORDER — AMOXICILLIN-POT CLAVULANATE 875-125 MG PO TABS: 1.0000 | ORAL_TABLET | Freq: Two times a day (BID) | ORAL | 0 refills | Status: AC

## 2020-06-20 MED ORDER — BENZONATATE 100 MG PO CAPS: 100.0000 mg | ORAL_CAPSULE | Freq: Three times a day (TID) | ORAL | 0 refills | Status: DC

## 2020-06-20 NOTE — Discharge Instructions (Signed)
Begin Augmentin twice daily for 10 days, take with food Daily cetirizine to help with congestion, drainage/throat irritation Tessalon/benzonatate every 8 hours for cough May use Tylenol/ibuprofen as needed for further throat pain or may pick up prior course of prednisone previously prescribed Drink plenty of fluids Follow-up if not improving or worsening

## 2020-06-20 NOTE — ED Provider Notes (Signed)
[name]    CSN: [id number] Arrival date & time: 06/20/20  1144      History   Chief Complaint Chief Complaint  Patient presents with  . Sore Throat    HPI Marvin Dean is a 30 y.o. male presenting today for follow-up of sore throat and cough.  Reports that over the past week he has had sore throat cough and congestion.  Feels as if he develops increased swelling and discomfort in his throat throughout the day.  Previously seen approximately 5 days ago with negative strep, COVID and flu.  He denies any known fevers.  Is not taking any medicines currently for his symptoms.  HPI  History reviewed. No pertinent past medical history.  Patient Active Problem List   Diagnosis Date Noted  . Pure hypercholesterolemia 09/06/2016  . Elevated BP without diagnosis of hypertension 09/06/2016    History reviewed. No pertinent surgical history.     Home Medications    Prior to Admission medications   Medication Sig Start Date End Date Taking? Authorizing Provider  amoxicillin-clavulanate (AUGMENTIN) 875-125 MG tablet Take 1 tablet by mouth every 12 (twelve) hours for 10 days. 06/20/20 06/30/20 Yes Kileen Lange C, PA-C  benzonatate (TESSALON) 100 MG capsule Take 1-2 capsules (100-200 mg total) by mouth every 8 (eight) hours. 06/20/20  Yes Kamani Lewter C, PA-C  Cetirizine HCl 10 MG CAPS Take 1 capsule (10 mg total) by mouth daily for 10 days. 06/20/20 06/30/20 Yes Thereasa Iannello C, PA-C  predniSONE (DELTASONE) 20 MG tablet Take 2 tablets (40 mg total) by mouth daily with breakfast for 4 days. 06/18/20 06/22/20  Raspet, Noberto Retort, PA-C  triamcinolone cream (KENALOG) 0.1 % Apply 1 application topically 2 (two) times daily. 05/26/19   Shade Flood, MD  atorvastatin (LIPITOR) 40 MG tablet Take 1 tablet (40 mg total) by mouth daily. Patient not taking: Reported on 05/09/2019 12/15/16 06/20/20  Morrell Riddle, PA-C    Family History Family History  Problem Relation Age of  Onset  . COPD Mother   . Lung cancer Mother     Social History Social History   Tobacco Use  . Smoking status: Never Smoker  . Smokeless tobacco: Never Used  Vaping Use  . Vaping Use: Never used  Substance Use Topics  . Alcohol use: Not Currently    Comment: 2 drinks a week  . Drug use: No     Allergies   Patient has no known allergies.   Review of Systems Review of Systems  Constitutional: Negative for activity change, appetite change, chills, fatigue and fever.  HENT: Positive for congestion, rhinorrhea, sinus pressure and sore throat. Negative for ear pain and trouble swallowing.   Eyes: Negative for discharge and redness.  Respiratory: Positive for cough. Negative for chest tightness and shortness of breath.   Cardiovascular: Negative for chest pain.  Gastrointestinal: Negative for abdominal pain, diarrhea, nausea and vomiting.  Musculoskeletal: Negative for myalgias.  Skin: Negative for rash.  Neurological: Negative for dizziness, light-headedness and headaches.     Physical Exam Triage Vital Signs ED Triage Vitals  Enc Vitals Group     BP 06/20/20 1219 (!) 150/83     Pulse Rate 06/20/20 1219 (!) 104     Resp 06/20/20 1219 18     Temp 06/20/20 1219 99.1 F (37.3 C)     Temp Source 06/20/20 1219 Oral     SpO2 06/20/20 1219 95 %     Weight --  Height --      Head Circumference --      Peak Flow --      Pain Score 06/20/20 1217 7     Pain Loc --      Pain Edu? --      Excl. in GC? --    No data found.  Updated Vital Signs BP (!) 150/83 (BP Location: Left Arm)   Pulse (!) 104   Temp 99.1 F (37.3 C) (Oral)   Resp 18   SpO2 95%   Visual Acuity Right Eye Distance:   Left Eye Distance:   Bilateral Distance:    Right Eye Near:   Left Eye Near:    Bilateral Near:     Physical Exam Vitals and nursing note reviewed.  Constitutional:      Appearance: He is well-developed.     Comments: No acute distress  HENT:     Head: Normocephalic and  atraumatic.     Ears:     Comments: Bilateral ears without tenderness to palpation of external auricle, tragus and mastoid, EAC's without erythema or swelling, TM's with good bony landmarks and cone of light. Non erythematous.     Nose: Nose normal.     Mouth/Throat:     Comments: Oral mucosa pink and moist, moderate sized tonsils with slight erythema, no exudate. Posterior pharynx patent and nonerythematous, no uvula deviation or swelling. Normal phonation. Eyes:     Conjunctiva/sclera: Conjunctivae normal.  Cardiovascular:     Rate and Rhythm: Normal rate.  Pulmonary:     Effort: Pulmonary effort is normal. No respiratory distress.     Comments: Breathing comfortably at rest, CTABL, no wheezing, rales or other adventitious sounds auscultated Abdominal:     General: There is no distension.  Musculoskeletal:        General: Normal range of motion.     Cervical back: Neck supple.  Skin:    General: Skin is warm and dry.  Neurological:     Mental Status: He is alert and oriented to person, place, and time.      UC Treatments / Results  Labs (all labs ordered are listed, but only abnormal results are displayed) Labs Reviewed - No data to display  EKG   Radiology No results found.  Procedures Procedures (including critical care time)  Medications Ordered in UC Medications - No data to display  Initial Impression / Assessment and Plan / UC Course  I have reviewed the triage vital signs and the nursing notes.  Pertinent labs & imaging results that were available during my care of the patient were reviewed by me and considered in my medical decision making (see chart for details).     URI symptoms greater than 1 week, treating with Augmentin to cover sinusitis/pharyngitis, and continuing symptomatic and supportive care of cough and congestion.  Push fluids.  Discussed using Tylenol/ibuprofen for throat pain versus starting a course of prednisone.  Advised against taking  together to minimize stomach irritation.  Discussed strict return precautions. Patient verbalized understanding and is agreeable with plan.  Final Clinical Impressions(s) / UC Diagnoses   Final diagnoses:  Sore throat  Acute sinusitis with symptoms > 10 days     Discharge Instructions     Begin Augmentin twice daily for 10 days, take with food Daily cetirizine to help with congestion, drainage/throat irritation Tessalon/benzonatate every 8 hours for cough May use Tylenol/ibuprofen as needed for further throat pain or may pick up prior course of prednisone  previously prescribed Drink plenty of fluids Follow-up if not improving or worsening    ED Prescriptions    Medication Sig Dispense Auth. Provider   amoxicillin-clavulanate (AUGMENTIN) 875-125 MG tablet Take 1 tablet by mouth every 12 (twelve) hours for 10 days. 20 tablet Surina Storts C, PA-C   Cetirizine HCl 10 MG CAPS Take 1 capsule (10 mg total) by mouth daily for 10 days. 10 capsule Hurley Blevins C, PA-C   benzonatate (TESSALON) 100 MG capsule Take 1-2 capsules (100-200 mg total) by mouth every 8 (eight) hours. 30 capsule Rutilio Yellowhair, Monona C, PA-C     PDMP not reviewed this encounter.   Lew Dawes, PA-C 06/20/20 1245

## 2020-06-20 NOTE — ED Triage Notes (Signed)
Pt presents with ongoing sore throat for over a week; pt had negative strep, culture, covid, and flu swab X 2 days ago.  Pt declined to take steriods prescribed 2 days ago.

## 2021-06-19 ENCOUNTER — Encounter: Payer: No Typology Code available for payment source | Admitting: Family Medicine

## 2021-08-07 ENCOUNTER — Encounter: Payer: No Typology Code available for payment source | Admitting: Family Medicine

## 2021-09-19 ENCOUNTER — Ambulatory Visit (INDEPENDENT_AMBULATORY_CARE_PROVIDER_SITE_OTHER): Payer: Self-pay | Admitting: Nurse Practitioner

## 2021-09-19 ENCOUNTER — Other Ambulatory Visit (HOSPITAL_COMMUNITY)
Admission: RE | Admit: 2021-09-19 | Discharge: 2021-09-19 | Disposition: A | Discharge status: Home / Self Care | Payer: No Typology Code available for payment source | Source: Ambulatory Visit | Attending: Nurse Practitioner | Admitting: Nurse Practitioner

## 2021-09-19 ENCOUNTER — Encounter: Payer: Self-pay | Admitting: Nurse Practitioner

## 2021-09-19 VITALS — BP 126/88 | HR 65 | Temp 97.3°F | Resp 12 | Ht 68.0 in | Wt 240.0 lb

## 2021-09-19 DIAGNOSIS — Z0000 Encounter for general adult medical examination without abnormal findings: Secondary | ICD-10-CM | POA: Diagnosis not present

## 2021-09-19 DIAGNOSIS — Z113 Encounter for screening for infections with a predominantly sexual mode of transmission: Secondary | ICD-10-CM | POA: Diagnosis not present

## 2021-09-19 DIAGNOSIS — Z6836 Body mass index (BMI) 36.0-36.9, adult: Secondary | ICD-10-CM | POA: Diagnosis not present

## 2021-09-19 DIAGNOSIS — L918 Other hypertrophic disorders of the skin: Secondary | ICD-10-CM

## 2021-09-19 DIAGNOSIS — E6609 Other obesity due to excess calories: Secondary | ICD-10-CM | POA: Insufficient documentation

## 2021-09-19 LAB — LIPID PANEL
Cholesterol: 303 mg/dL — ABNORMAL HIGH (ref 0–200)
HDL: 34.7 mg/dL — ABNORMAL LOW (ref >39.00)
LDL Cholesterol: 236 mg/dL — ABNORMAL HIGH (ref 0–99)
NonHDL: 267.91
Total CHOL/HDL Ratio: 9
Triglycerides: 160 mg/dL — ABNORMAL HIGH (ref 0.0–149.0)
VLDL: 32 mg/dL (ref 0.0–40.0)

## 2021-09-19 LAB — COMPREHENSIVE METABOLIC PANEL
ALT: 63 U/L — ABNORMAL HIGH (ref 0–53)
AST: 23 U/L (ref 0–37)
Albumin: 4.7 g/dL (ref 3.5–5.2)
Alkaline Phosphatase: 118 U/L — ABNORMAL HIGH (ref 39–117)
BUN: 11 mg/dL (ref 6–23)
CO2: 29 mEq/L (ref 19–32)
Calcium: 9.7 mg/dL (ref 8.4–10.5)
Chloride: 102 mEq/L (ref 96–112)
Creatinine, Ser: 0.85 mg/dL (ref 0.40–1.50)
GFR: 114.82 mL/min (ref >60.00)
Glucose, Bld: 85 mg/dL (ref 70–99)
Potassium: 4.4 mEq/L (ref 3.5–5.1)
Sodium: 140 mEq/L (ref 135–145)
Total Bilirubin: 0.7 mg/dL (ref 0.2–1.2)
Total Protein: 7.7 g/dL (ref 6.0–8.3)

## 2021-09-19 LAB — CBC
HCT: 45.6 % (ref 39.0–52.0)
Hemoglobin: 15.1 g/dL (ref 13.0–17.0)
MCHC: 33 g/dL (ref 30.0–36.0)
MCV: 86.2 fl (ref 78.0–100.0)
Platelets: 274 10*3/uL (ref 150.0–400.0)
RBC: 5.29 Mil/uL (ref 4.22–5.81)
RDW: 13.3 % (ref 11.5–15.5)
WBC: 6.6 10*3/uL (ref 4.0–10.5)

## 2021-09-19 LAB — TSH: TSH: 1.84 u[IU]/mL (ref 0.35–5.50)

## 2021-09-19 LAB — HEMOGLOBIN A1C: Hgb A1c MFr Bld: 5.6 % (ref 4.6–6.5)

## 2021-09-19 NOTE — Assessment & Plan Note (Signed)
Discussed healthy lifestyle modifications in regards to exercise recommended 30 minutes a day 5 times a week.  Patient is working on getting back into the gym.

## 2021-09-19 NOTE — Assessment & Plan Note (Signed)
Discussed age-appropriate immunizations and screening exams.  Did review exercise recommendations of 30 minutes a day 5 times a week.

## 2021-09-19 NOTE — Assessment & Plan Note (Signed)
Verbal consent obtained.  Patient was laid supine on the bed and pain ease spray was used to anesthetize the skin.  Betadine swab was then used to clean the area.  Sterile tweezers was used along with 10 blade scalpel skin tag was pulled up and talk and removed using a scalpel.  This was repeated for a total of 2 skin tags on neck.  Bleeding was controlled with gauze and slight pressure.  Bleeding stopped and Band-Aid placed over both spots.  Patient tolerated procedure well no immediate complications.

## 2021-09-19 NOTE — Patient Instructions (Signed)
Nice to see you today I will be in touch with the labs once I have them  Follow up with me in 1 year, sooner if you need me  The recommendation is 30 mins of exercise 5 times a week 

## 2021-09-19 NOTE — Progress Notes (Signed)
New Patient Office Visit  Subjective    Patient ID: Marvin Dean, male    DOB: [birth year]  Age: 31 y.o. MRN: [id number]  CC:  Chief Complaint  Patient presents with   Establish Care    Previous PCP with [facility] [place]    HPI [name] presents to establish care  for complete physical and follow up of chronic conditions.  Immunizations: -Tetanus:2024 -Influenza: Out of season -Covid-19:  first 2 shots patient unsure as to which Vaccines he had -Shingles: Too [place] -Pneumonia: Too [place]  Diet: Fair diet.  3 meals a day with some snacking. Water Verlon Au who today Exercise: No regular exercise. Once a week. 45 min- 1 hour patient is trying to increase this  Eye exam: PRN Dental exam: Needs updating   Colonoscopy: Too Hank[place], currently average risk Lung Cancer Screening: N/A Dexa: N/A  PSA: Too [place], currently average risk  Sleep: 11-12 and get up around 6am. Feels rested. Snore.   Skin tags: Patient has had skin tags on his neck before that previous primary care were able to remove.  He has 2 skin tags on left medial anterior neck and right lateral anterior neck that would like to have removed.  States the second lesion gets inflamed and gets caught on his shirt and clothing.     Outpatient Encounter Medications as of 09/19/2021  Medication Sig   [DISCONTINUED] atorvastatin (LIPITOR) 40 MG tablet Take 1 tablet (40 mg total) by mouth daily. (Patient not taking: Reported on 05/09/2019)   [DISCONTINUED] benzonatate (TESSALON) 100 MG capsule Take 1-2 capsules (100-200 mg total) by mouth every 8 (eight) hours.   [DISCONTINUED] Cetirizine HCl 10 MG CAPS Take 1 capsule (10 mg total) by mouth daily for 10 days.   [DISCONTINUED] triamcinolone cream (KENALOG) 0.1 % Apply 1 application topically 2 (two) times daily.   No facility-administered encounter medications on file as of 09/19/2021.    No past medical history on file.  Past Surgical History:  Procedure  Laterality Date   NO PAST SURGERIES      Family History  Problem Relation Age of Onset   COPD Mother    Lung cancer Mother     Social History   Socioeconomic History   Marital status: Single    Spouse name: Not on file   Number of children: 4   Years of education: Not on file   Highest education level: Associate degree: academic program  Occupational History   Occupation: Curator    Comment: Astronomer  Tobacco Use   Smoking status: Never    Passive exposure: Past   Smokeless tobacco: Never  Vaping Use   Vaping Use: Never used  Substance and Sexual Activity   Alcohol use: Yes    Comment: once a week maybe. beer and liquor.   Drug use: No   Sexual activity: Yes    Partners: Female    Birth control/protection: None  Other Topics Concern   Not on file  Social History Narrative         Fulltime: Self employed trucking company   Social Determinants of Corporate investment banker Strain: Not on file  Food Insecurity: Not on file  Transportation Needs: Not on file  Physical Activity: Not on file  Stress: Not on file  Social Connections: Not on file  Intimate Partner Violence: Not on file    Review of Systems  Constitutional:  Negative for chills, fever and malaise/fatigue.  Respiratory:  Negative for shortness  of breath.   Cardiovascular:  Negative for chest pain and leg swelling.  Gastrointestinal:  Negative for abdominal pain, blood in stool, constipation, diarrhea, nausea and vomiting.       BM daily  Genitourinary:  Negative for dysuria and hematuria.  Neurological:  Negative for tingling and headaches.  Psychiatric/Behavioral:  Negative for hallucinations and suicidal ideas.         Objective    BP 126/88   Pulse 65   Temp (!) 97.3 F (36.3 C)   Resp 12   Ht 5\' 8"  (1.727 m)   Wt 240 lb (108.9 kg)   SpO2 97%   BMI 36.49 kg/m   Physical Exam Vitals and nursing note reviewed.  Constitutional:      Appearance: Normal appearance. He is  obese.  HENT:     Right Ear: Tympanic membrane, ear canal and external ear normal.     Left Ear: Tympanic membrane, ear canal and external ear normal.  Cardiovascular:     Rate and Rhythm: Normal rate and regular rhythm.     Pulses: Normal pulses.     Heart sounds: Normal heart sounds.  Pulmonary:     Effort: Pulmonary effort is normal.     Breath sounds: Normal breath sounds.  Abdominal:     General: Bowel sounds are normal. There is no distension.     Palpations: There is no mass.     Tenderness: There is no abdominal tenderness.     Hernia: No hernia is present. There is no hernia in the left inguinal area or right inguinal area.  Genitourinary:    Penis: Normal.      Testes: Normal.     Epididymis:     Right: Normal.     Left: Normal.  Musculoskeletal:     Right lower leg: No edema.     Left lower leg: No edema.  Lymphadenopathy:     Cervical: No cervical adenopathy.     Lower Body: No right inguinal adenopathy. No left inguinal adenopathy.  Skin:      Neurological:     General: No focal deficit present.     Mental Status: He is alert.     Comments: Bilateral upper and lower extremity strength 5/5  Psychiatric:        Mood and Affect: Mood normal.        Behavior: Behavior normal.        Thought Content: Thought content normal.        Judgment: Judgment normal.         Assessment & Plan:   Problem List Items Addressed This Visit       Musculoskeletal and Integument   Skin tag    Verbal consent obtained.  Patient was laid supine on the bed and pain ease spray was used to anesthetize the skin.  Betadine swab was then used to clean the area.  Sterile tweezers was used along with 10 blade scalpel skin tag was pulled up and talk and removed using a scalpel.  This was repeated for a total of 2 skin tags on neck.  Bleeding was controlled with gauze and slight pressure.  Bleeding stopped and Band-Aid placed over both spots.  Patient tolerated procedure well no  immediate complications.        Other   Preventative health care - Primary    Discussed age-appropriate immunizations and screening exams.  Did review exercise recommendations of 30 minutes a day 5 times a week.  Relevant Orders   CBC (Completed)   Lipid panel (Completed)   TSH (Completed)   Comprehensive metabolic panel (Completed)   Hemoglobin A1c (Completed)   Class 2 obesity due to excess calories without serious comorbidity with body mass index (BMI) of 36.0 to 36.9 in adult    Discussed healthy lifestyle modifications in regards to exercise recommended 30 minutes a day 5 times a week.  Patient is working on getting back into the gym.      Relevant Orders   Lipid panel (Completed)   TSH (Completed)   Hemoglobin A1c (Completed)   Routine screening for STI (sexually transmitted infection)    No overt concerns no concerns on exam routine screening.      Relevant Orders   Urine cytology ancillary only   HIV Antibody (routine testing w rflx)   RPR   HSV(herpes simplex vrs) 1+2 ab-IgG    Return in about 1 year (around 09/20/2022) for CPE and labs.   Audria Nine, NP

## 2021-09-19 NOTE — Assessment & Plan Note (Signed)
No overt concerns no concerns on exam routine screening.

## 2021-09-20 ENCOUNTER — Ambulatory Visit: Payer: No Typology Code available for payment source | Admitting: Nurse Practitioner

## 2021-09-20 LAB — URINE CYTOLOGY ANCILLARY ONLY
Chlamydia: NEGATIVE
Comment: NEGATIVE
Comment: NEGATIVE
Comment: NORMAL
Neisseria Gonorrhea: NEGATIVE
Trichomonas: NEGATIVE

## 2021-09-21 LAB — HSV(HERPES SIMPLEX VRS) I + II AB-IGG
HAV 1 IGG,TYPE SPECIFIC AB: 2.02 index — ABNORMAL HIGH
HSV 2 IGG,TYPE SPECIFIC AB: 6.15 index — ABNORMAL HIGH

## 2021-09-21 LAB — HIV ANTIBODY (ROUTINE TESTING W REFLEX): HIV 1&2 Ab, 4th Generation: NONREACTIVE

## 2021-09-21 LAB — RPR: RPR Ser Ql: NONREACTIVE

## 2021-10-09 ENCOUNTER — Telehealth: Payer: Self-pay | Admitting: Nurse Practitioner

## 2021-10-09 NOTE — Telephone Encounter (Signed)
-----   Message from [facility], [place] sent at 10/08/2021 12:19 PM EDT ----- Patient advised. Patient states that he has not had a break out of herpes in the past that he knows off, no genital rash or break out. Anything he needs to do about this at this time? What does it mean for him? Any recommendations

## 2021-10-09 NOTE — Telephone Encounter (Signed)
Nothing to do at this time. This is an antibody test only. If he has not had lesion or rash on his genitals then he is ok

## 2021-10-09 NOTE — Telephone Encounter (Signed)
Patient advised.

## 2022-03-24 ENCOUNTER — Ambulatory Visit: Payer: Self-pay | Admitting: Nurse Practitioner

## 2022-03-24 ENCOUNTER — Telehealth: Payer: Self-pay

## 2022-03-24 NOTE — Telephone Encounter (Signed)
[name] - Client Nonclinical Telephone Record  [facility] Client Marvin Dean [facility] Night - Client Client Site [place] - Night Provider Romilda Garret- NP Contact Type Call Who Is Calling Patient / Member / Family / Caregiver Caller Name Breyton Vanscyoc Caller Phone Number [phone] Patient Name Marvin Dean Patient DOB Apr 26, 1990 Call Type Message Only Information Provided Reason for Call Request to Reschedule Office Appointment Initial Comment Caller states he needs to reschedule an appt that he has at 8 am. Caller states he got called into work. Patient request to speak to RN No Additional Comment Office hours provided. Caller denied triage. Disp. Time Disposition Final User 03/24/2022 8:05:04 AM General Information Provided Yes Anthony Sar Call Closed By: Anthony Sar Transaction Date/Time: 03/24/2022 8:01:45 AM (ET

## 2022-08-13 ENCOUNTER — Ambulatory Visit (INDEPENDENT_AMBULATORY_CARE_PROVIDER_SITE_OTHER): Payer: Self-pay | Admitting: Primary Care

## 2022-08-13 ENCOUNTER — Encounter: Payer: Self-pay | Admitting: Primary Care

## 2022-08-13 VITALS — BP 122/66 | HR 102 | Temp 99.1°F | Ht 68.0 in | Wt 236.0 lb

## 2022-08-13 DIAGNOSIS — J069 Acute upper respiratory infection, unspecified: Secondary | ICD-10-CM

## 2022-08-13 LAB — POCT INFLUENZA A/B
Influenza A, POC: NEGATIVE
Influenza B, POC: NEGATIVE

## 2022-08-13 LAB — POC COVID19 BINAXNOW: SARS Coronavirus 2 Ag: NEGATIVE

## 2022-08-13 LAB — POCT RAPID STREP A (OFFICE): Rapid Strep A Screen: NEGATIVE

## 2022-08-13 NOTE — Patient Instructions (Signed)
Take Tylenol/ibuprofen as discussed.  Hydrate well with water and rest.  Please notify us if your symptoms have not resolved on Tuesday next week.   It was a pleasure meeting you!

## 2022-08-13 NOTE — Assessment & Plan Note (Signed)
Symptoms and presentation representative of viral etiology at this point. Exam overall reassuring.  Negative rapid flu, COVID, strep test today.  Discussed regular use of Tylenol/ibuprofen, fluids, rest.  Toradol 60 mg IM offered today for his headache, he kindly declines. He does not need a work note.  Return precautions provided.

## 2022-08-13 NOTE — Progress Notes (Signed)
Subjective:    Patient ID: Marvin Dean, male    DOB: Dec 16, 1990, 32 y.o.   MRN: [id number]  Fever  Associated symptoms include headaches and a sore throat. Pertinent negatives include no congestion or coughing.    Marvin Dean is a very pleasant 32 y.o. male patient of Matt, NP with a history of elevated blood pressure reading, obesity, hyperlipidemia who presents today to discuss URI symptoms.  Symptom onset yesterday with fever (101), body aches, chills, headaches, joint aches. This morning he woke up feeling lightheaded with a sore throat and continued headaches and bodyaches.  He took a home COVID test yesterday morning which was negative.   He denies cough, sick contacts, recent known tick bites, sinus pressure nasal congestion. He's taken Tylenol last night. Today he's feeling slightly better except for his headaches.    Review of Systems  Constitutional:  Positive for chills, fatigue and fever.  HENT:  Positive for sore throat. Negative for congestion and sinus pressure.   Respiratory:  Negative for cough.   Musculoskeletal:  Positive for myalgias.  Neurological:  Positive for headaches.         History reviewed. No pertinent past medical history.  Social History   Socioeconomic History   Marital status: Single    Spouse name: Not on file   Number of children: 4   Years of education: Not on file   Highest education level: Associate degree: academic program  Occupational History   Occupation: Curator    Comment: Astronomer  Tobacco Use   Smoking status: Never    Passive exposure: Past   Smokeless tobacco: Never  Vaping Use   Vaping Use: Never used  Substance and Sexual Activity   Alcohol use: Yes    Comment: once a week maybe. beer and liquor.   Drug use: No   Sexual activity: Yes    Partners: Female    Birth control/protection: None  Other Topics Concern   Not on file  Social History Narrative         Fulltime: Self employed trucking  company   Social Determinants of Corporate investment banker Strain: Not on Ship broker Insecurity: Not on file  Transportation Needs: Not on file  Physical Activity: Not on file  Stress: Not on file  Social Connections: Not on file  Intimate Partner Violence: Not on file    Past Surgical History:  Procedure Laterality Date   NO PAST SURGERIES      Family History  Problem Relation Age of Onset   COPD Mother    Lung cancer Mother     No Known Allergies  No current outpatient medications on file prior to visit.   No current facility-administered medications on file prior to visit.    BP 122/66   Pulse (!) 102   Temp 99.1 F (37.3 C) (Oral)   Ht 5\' 8"  (1.727 m)   Wt 236 lb (107 kg)   SpO2 97%   BMI 35.88 kg/m  Objective:   Physical Exam Constitutional:      Appearance: He is ill-appearing.  HENT:     Right Ear: Tympanic membrane and ear canal normal.     Left Ear: Tympanic membrane and ear canal normal.     Nose: No mucosal edema.     Right Sinus: No maxillary sinus tenderness or frontal sinus tenderness.     Left Sinus: No maxillary sinus tenderness or frontal sinus tenderness.     Mouth/Throat:  Mouth: Mucous membranes are moist.     Pharynx: Posterior oropharyngeal erythema present.  Eyes:     Conjunctiva/sclera: Conjunctivae normal.  Cardiovascular:     Rate and Rhythm: Normal rate and regular rhythm.  Pulmonary:     Effort: Pulmonary effort is normal.     Breath sounds: Normal breath sounds. No wheezing or rales.  Musculoskeletal:     Cervical back: Neck supple.  Skin:    General: Skin is warm and dry.           Assessment & Plan:  Viral URI Assessment & Plan: Symptoms and presentation representative of viral etiology at this point. Exam overall reassuring.  Negative rapid flu, COVID, strep test today.  Discussed regular use of Tylenol/ibuprofen, fluids, rest.  Toradol 60 mg IM offered today for his headache, he kindly declines. He  does not need a work note.  Return precautions provided.    Orders: -     POC COVID-19 BinaxNow -     POCT rapid strep A -     POCT Influenza A/B        Doreene Nest, NP

## 2024-02-27 ENCOUNTER — Encounter: Payer: Self-pay | Admitting: *Deleted

## 2024-02-27 ENCOUNTER — Ambulatory Visit
Admission: EM | Admit: 2024-02-27 | Discharge: 2024-02-27 | Disposition: A | Discharge status: Home / Self Care | Payer: Self-pay

## 2024-02-27 DIAGNOSIS — J069 Acute upper respiratory infection, unspecified: Secondary | ICD-10-CM

## 2024-02-27 DIAGNOSIS — R070 Pain in throat: Secondary | ICD-10-CM

## 2024-02-27 LAB — POCT RAPID STREP A (OFFICE): Rapid Strep A Screen: NEGATIVE

## 2024-02-27 NOTE — ED Provider Notes (Signed)
 " [facility]    CSN: [id number] Arrival date & time: 02/27/24  0801      History   Chief Complaint Chief Complaint  Patient presents with   Sore Throat   Nasal Congestion    HPI Marvin Dean is a 33 y.o. male.   Pt presents today due to 1 week of throat pain and nasal congestion. Pt states that he has had subjective fever and fatigue. Pt states that he has been using Dayquil, Nyquil, and Tylenol  for symptoms with mild relief. Pt denies known sick contacts.   The history is provided by the patient.  Sore Throat    History reviewed. No pertinent past medical history.  Patient Active Problem List   Diagnosis Date Noted   Viral URI 08/13/2022   Preventative health care 09/19/2021   Class 2 obesity due to excess calories without serious comorbidity with body mass index (BMI) of 36.0 to 36.9 in adult 09/19/2021   Skin tag 09/19/2021   Routine screening for STI (sexually transmitted infection) 09/19/2021   Pure hypercholesterolemia 09/06/2016   Elevated BP without diagnosis of hypertension 09/06/2016    Past Surgical History:  Procedure Laterality Date   NO PAST SURGERIES         Home Medications    Prior to Admission medications  Not on File    Family History Family History  Problem Relation Age of Onset   COPD Mother    Lung cancer Mother     Social History Social History[1]   Allergies   Patient has no known allergies.   Review of Systems Review of Systems   Physical Exam Triage Vital Signs ED Triage Vitals  Encounter Vitals Group     BP 02/27/24 0816 (!) 136/91     Girls Systolic BP Percentile --      Girls Diastolic BP Percentile --      Boys Systolic BP Percentile --      Boys Diastolic BP Percentile --      Pulse Rate 02/27/24 0816 85     Resp 02/27/24 0816 18     Temp 02/27/24 0816 98 F (36.7 C)     Temp Source 02/27/24 0816 Oral     SpO2 02/27/24 0816 95 %     Weight --      Height --      Head Circumference --       Peak Flow --      Pain Score 02/27/24 0830 8     Pain Loc --      Pain Education --      Exclude from Growth Chart --    No data found.  Updated Vital Signs BP (!) 136/91 (BP Location: Left Arm)   Pulse 85   Temp 98 F (36.7 C) (Oral)   Resp 18   SpO2 95%   Visual Acuity Right Eye Distance:   Left Eye Distance:   Bilateral Distance:    Right Eye Near:   Left Eye Near:    Bilateral Near:     Physical Exam Vitals and nursing note reviewed.  Constitutional:      General: He is not in acute distress.    Appearance: Normal appearance. He is not ill-appearing, toxic-appearing or diaphoretic.  HENT:     Nose: Congestion (moderately enlarged turbinates) present. No rhinorrhea.     Mouth/Throat:     Pharynx: Oropharyngeal exudate and posterior oropharyngeal erythema present.  Eyes:     General: No scleral icterus.  Cardiovascular:     Rate and Rhythm: Normal rate and regular rhythm.     Heart sounds: Normal heart sounds.  Pulmonary:     Effort: Pulmonary effort is normal. No respiratory distress.     Breath sounds: Normal breath sounds. No wheezing or rhonchi.  Musculoskeletal:     Cervical back: No tenderness.  Lymphadenopathy:     Cervical: No cervical adenopathy.  Skin:    General: Skin is warm.  Neurological:     Mental Status: He is alert and oriented to person, place, and time.  Psychiatric:        Mood and Affect: Mood normal.        Behavior: Behavior normal.      UC Treatments / Results  Labs (all labs ordered are listed, but only abnormal results are displayed) Labs Reviewed  POCT RAPID STREP A (OFFICE)    EKG   Radiology No results found.  Procedures Procedures (including critical care time)  Medications Ordered in UC Medications - No data to display  Initial Impression / Assessment and Plan / UC Course  I have reviewed the triage vital signs and the nursing notes.  Pertinent labs & imaging results that were available during my  care of the patient were reviewed by me and considered in my medical decision making (see chart for details).     Final Clinical Impressions(s) / UC Diagnoses   Final diagnoses:  Throat pain   Discharge Instructions   None    ED Prescriptions   None    PDMP not reviewed this encounter.    [1]  Social History Tobacco Use   Smoking status: Never    Passive exposure: Past   Smokeless tobacco: Never  Vaping Use   Vaping status: Never Used  Substance Use Topics   Alcohol use: Yes    Comment: once a week maybe. beer and liquor.   Drug use: No     Andra Corean BROCKS, PA-C 02/27/24 [id number]  "

## 2024-02-27 NOTE — Discharge Instructions (Signed)
 Strep is neg.   You been diagnosed with a viral illness today. -Viruses have to run their course and medicines that are prescribed are meant to help with symptoms. - With viruses usually feel poorly from 3 to 7 days with cough being the last symptoms to resolve.  -Cough can linger from days to weeks.  Antibiotics are not effective for viruses. -If your cough lasts more than 2 weeks and you are coughing so hard that you are vomiting or feel like you could pass out we need to follow-up with PCP for further testing and evaluation. -Rest, increase water intake, may use pseudoephedrine for nasal congestion, Delsym (dextromethorphan) or honey as needed for cough, and ibuprofen  and/or Tylenol  as directed on packaging for pain and fever. -If you have hypertension you should take Coricidin or other OTC meds approved for people with high blood pressure. -You may use a spoonful of honey every 4-6 hours as needed for throat pain and cough. -Warm tea with honey and lemon are helpful for soothe throat as well.  Chloraseptic and Cepacol make a throat lozenge with numbing medication, can be purchased over-the-counter. -May also use Flonase or sinus rinse for sinus pressure or nasal congestion.  Be sure to use distilled bottled water for sinus rinses. -May use coolmist humidifier to open up nasal passages -May elevate head to assist with postnasal drainage. -If you feel poorly (fever, fatigue, shortness of breath, nausea, etc.) for more than 10 days to be sure to follow-up with PCP or in clinic for further evaluation and additional treatments. If you experience chest pain with shortness of breath or pulse oxygen less than 95% you should report to the ER.

## 2024-02-27 NOTE — ED Triage Notes (Signed)
 Congestion and sore throat x 1 week. Taking OTC cold meds. No known fever. He took nyquil last night.. States his throat hurts worse after I eat.

## 2024-03-25 ENCOUNTER — Ambulatory Visit (INDEPENDENT_AMBULATORY_CARE_PROVIDER_SITE_OTHER): Payer: Self-pay

## 2024-03-25 VITALS — BP 138/88 | HR 97 | Temp 98.2°F | Ht 68.0 in | Wt 248.0 lb

## 2024-03-25 DIAGNOSIS — B9689 Other specified bacterial agents as the cause of diseases classified elsewhere: Secondary | ICD-10-CM

## 2024-03-25 DIAGNOSIS — R865 Abnormal microbiological findings in specimens from male genital organs: Secondary | ICD-10-CM

## 2024-03-25 MED ORDER — METRONIDAZOLE 500 MG PO TABS: 500.0000 mg | ORAL_TABLET | Freq: Two times a day (BID) | ORAL | 0 refills | Status: AC

## 2024-03-25 MED ORDER — CLINDAMYCIN PHOSPHATE 1 % EX LOTN: TOPICAL_LOTION | Freq: Two times a day (BID) | CUTANEOUS | 0 refills | Status: AC

## 2024-03-25 NOTE — Progress Notes (Unsigned)
 "  Subjective:   This visit was conducted in person. The patient gave informed consent to the use of Abridge AI technology to record the contents of the encounter as documented below.   Patient ID: Marvin Dean, male    DOB: Mar 28, 1990, 34 y.o.   MRN: [id number]   Discussed the use of AI scribe software for clinical note transcription with the patient, who gave verbal consent to proceed.  History of Present Illness Marvin Dean is a 34 year old male who presents for evaluation and treatment related to his girlfriend's diagnosis of bacterial vaginosis.  His girlfriend was diagnosed with bacterial vaginosis (BV) and believes it may have originated from him. She has had BV twice in the last couple of years and frequently experiences yeast infections, which he suspects may be related to his semen. The yeast infections occur less frequently if he does not ejaculate inside her.  He denies any symptoms such as penile discharge, burning during urination, or itching. However, he occasionally notices a 'fishy smell' from his penis after sexual activity if he does not wash immediately, attributing this to dried semen.  His girlfriend, present during the consultation, shared that she has had recurrent yeast infections for over ten years and has been actively managing them. She was treated for a yeast infection in November and had no symptoms in December. Recently, she experienced symptoms suggestive of a yeast infection, but a swab showed BV. She does not currently have a fishy odor, which she associates with BV, and has been taking steps such as using probiotics and drinking plenty of water to manage her symptoms.  He is not currently on any antibiotics and has not been diagnosed with diabetes. He showers at least once a day, typically at night after work, and works long hours, which makes it challenging to maintain hygiene during the day. He recalls experiencing jock itch in the past due to working long  hours in the heat.      Review of Systems      Allergies[1]  Medications Ordered Prior to Encounter[2]  BP 138/88 (BP Location: Left Arm, Patient Position: Sitting, Cuff Size: Large)   Pulse 97   Temp 98.2 F (36.8 C) (Oral)   Ht 5' 8 (1.727 m)   Wt 248 lb (112.5 kg)   SpO2 96%   BMI 37.71 kg/m   Objective:      Physical Exam GENERAL: Alert, cooperative, well developed, no acute distress. HEAD: Normocephalic atraumatic. EYES: Extraocular movements intact BL, pupils round, equal and reactive to light BL, conjunctivae normal BL. EARS: Tympanic membrane, ear canal and external ear normal BL. NOSE: No congestion or rhinorrhea, mucous membranes are moist. THROAT: No oropharyngeal exudate or posterior oropharyngeal erythema. CARDIOVASCULAR: Normal heart rate and rhythm, S1 and S2 normal without murmurs. CHEST: Clear to auscultation bilaterally, no wheezes, rhonchi, or crackles. ABDOMEN: Soft, non tender, non distended, without organomegaly, normal bowel sounds. EXTREMITIES: No cyanosis or edema. NEUROLOGICAL: Oriented to person, place and time, no gait abnormalities, moves all extremities without gross motor or sensory deficit.         Assessment & Plan:   Assessment & Plan Bacterial vaginosis  Orders:   metroNIDAZOLE  (FLAGYL ) 500 MG tablet; Take 1 tablet (500 mg total) by mouth 2 (two) times daily for 7 days.   clindamycin  (CLEOCIN  T) 1 % lotion; Apply topically 2 (two) times daily for 7 days.   GF with BV, only sexually active with her, gets frequent yeats infections unsure how  many times she has had BV, thinks twice in the past years No penile/urinary sx, GF says fishy smell penis only when not washing after sex  Recurrent yeast infections, no fishy odor,  Cotton pants Metronidazole    400 mg twice daily, in combination with topical clindamycin , for 7 days        No follow-ups on file.   Jorryn Casagrande K Nylani Michetti, MD  03/25/24     Contains text generated by  Abridge.          [1] No Known Allergies [2] No current outpatient medications on file prior to visit.   No current facility-administered medications on file prior to visit.  "

## 2024-03-25 NOTE — Patient Instructions (Addendum)
 Thank you for visiting [facility] today! Here's what we talked about: - START Metronidazole antibiotic twice daily and Clindamycin cream on penis twice daily. - Wash genitals everytime after masturbating - Try to shower twice a day

## 2024-03-28 ENCOUNTER — Telehealth: Payer: Self-pay

## 2024-03-28 DIAGNOSIS — E7800 Pure hypercholesterolemia, unspecified: Secondary | ICD-10-CM

## 2024-03-28 DIAGNOSIS — E669 Obesity, unspecified: Secondary | ICD-10-CM

## 2024-03-28 DIAGNOSIS — Z113 Encounter for screening for infections with a predominantly sexual mode of transmission: Secondary | ICD-10-CM

## 2024-03-28 DIAGNOSIS — Z131 Encounter for screening for diabetes mellitus: Secondary | ICD-10-CM

## 2024-03-28 NOTE — Telephone Encounter (Signed)
 Copied from CRM #[id number]. Topic: Clinical - Request for Lab/Test Order >> Mar 28, 2024 11:33 AM Revonda D wrote: Reason for CRM: Pt's spouse carmen stated that the pt wants to come in for fasting labs before his physical on 3/19 and would like a callback to get the appt scheduled. Pt needs to have the labs ordered and stated that they want a full lab completed. RA:[id number]

## 2024-04-01 NOTE — Telephone Encounter (Signed)
 Matt: yes, pt would like STI/STD testing added on.   Renae: Please schedule patient with fasting labs  Physical date: 05/19/24

## 2024-04-01 NOTE — Addendum Note (Signed)
 Addended by: WENDEE LYNWOOD HERO on: 04/01/2024 07:56 AM   Modules accepted: Orders

## 2024-04-04 NOTE — Telephone Encounter (Signed)
 Orders placed.

## 2024-05-19 ENCOUNTER — Encounter: Payer: Self-pay | Admitting: Nurse Practitioner
